# Patient Record
Sex: Female | Born: 1980 | Race: White | Hispanic: No | Marital: Married | Smoking: Never smoker
Health system: Southern US, Community
[De-identification: ages and names within clinical notes are randomized; demographics above are authoritative.]

## PROBLEM LIST (undated history)

## (undated) ENCOUNTER — Inpatient Hospital Stay (HOSPITAL_COMMUNITY): Payer: Self-pay

## (undated) DIAGNOSIS — A6920 Lyme disease, unspecified: Secondary | ICD-10-CM

## (undated) DIAGNOSIS — D649 Anemia, unspecified: Secondary | ICD-10-CM

## (undated) DIAGNOSIS — F411 Generalized anxiety disorder: Secondary | ICD-10-CM

## (undated) DIAGNOSIS — E7800 Pure hypercholesterolemia, unspecified: Secondary | ICD-10-CM

## (undated) DIAGNOSIS — Z8619 Personal history of other infectious and parasitic diseases: Secondary | ICD-10-CM

## (undated) DIAGNOSIS — Z141 Cystic fibrosis carrier: Secondary | ICD-10-CM

## (undated) DIAGNOSIS — M25562 Pain in left knee: Secondary | ICD-10-CM

## (undated) DIAGNOSIS — Z8759 Personal history of other complications of pregnancy, childbirth and the puerperium: Secondary | ICD-10-CM

## (undated) DIAGNOSIS — M25561 Pain in right knee: Secondary | ICD-10-CM

## (undated) DIAGNOSIS — R51 Headache: Secondary | ICD-10-CM

## (undated) DIAGNOSIS — T7840XA Allergy, unspecified, initial encounter: Secondary | ICD-10-CM

## (undated) DIAGNOSIS — R519 Headache, unspecified: Secondary | ICD-10-CM

## (undated) DIAGNOSIS — J309 Allergic rhinitis, unspecified: Secondary | ICD-10-CM

## (undated) HISTORY — DX: Personal history of other complications of pregnancy, childbirth and the puerperium: Z87.59

## (undated) HISTORY — DX: Pain in right knee: M25.561

## (undated) HISTORY — DX: Lyme disease, unspecified: A69.20

## (undated) HISTORY — DX: Cystic fibrosis carrier: Z14.1

## (undated) HISTORY — DX: Pain in right knee: M25.562

## (undated) HISTORY — DX: Personal history of other infectious and parasitic diseases: Z86.19

## (undated) HISTORY — DX: Allergy, unspecified, initial encounter: T78.40XA

## (undated) HISTORY — DX: Allergic rhinitis, unspecified: J30.9

## (undated) HISTORY — DX: Generalized anxiety disorder: F41.1

## (undated) HISTORY — DX: Pure hypercholesterolemia, unspecified: E78.00

---

## 2003-03-23 ENCOUNTER — Other Ambulatory Visit: Admission: RE | Admit: 2003-03-23 | Discharge: 2003-03-23 | Payer: Self-pay | Admitting: Obstetrics and Gynecology

## 2011-02-03 NOTE — L&D Delivery Note (Signed)
Delivery Note  SVD viable female Apgars 8,9 over 2nd degree ML Lac.  Placenta delivered spontaneously intact with 3VC. Repair with 2-0 Chromic with good support and hemostasis noted and R/V exam confirms.  PH art was snet.  [place] cord blood was done.  Mild pp atony responded to massage, MEU of clots and pr of cytotec  Mother and baby were doing well.  EBL 450cc  Candice Camp, MD

## 2011-02-09 LAB — OB RESULTS CONSOLE ABO/RH: RH Type: POSITIVE

## 2011-02-09 LAB — OB RESULTS CONSOLE ANTIBODY SCREEN: Antibody Screen: NEGATIVE

## 2011-02-09 LAB — OB RESULTS CONSOLE RPR: RPR: NONREACTIVE

## 2011-08-20 LAB — OB RESULTS CONSOLE GBS: GBS: NEGATIVE

## 2011-09-02 ENCOUNTER — Encounter (HOSPITAL_COMMUNITY): Payer: Self-pay | Admitting: *Deleted

## 2011-09-02 ENCOUNTER — Encounter (HOSPITAL_COMMUNITY): Payer: Self-pay | Admitting: Anesthesiology

## 2011-09-02 ENCOUNTER — Inpatient Hospital Stay (HOSPITAL_COMMUNITY)
Admission: AD | Admit: 2011-09-02 | Discharge: 2011-09-04 | DRG: 372 | Disposition: A | Discharge status: Home / Self Care | Payer: BC Managed Care – PPO | Source: Ambulatory Visit | Attending: Obstetrics and Gynecology | Admitting: Obstetrics and Gynecology

## 2011-09-02 ENCOUNTER — Inpatient Hospital Stay (HOSPITAL_COMMUNITY): Payer: BC Managed Care – PPO | Admitting: Anesthesiology

## 2011-09-02 DIAGNOSIS — O139 Gestational [pregnancy-induced] hypertension without significant proteinuria, unspecified trimester: Principal | ICD-10-CM | POA: Diagnosis present

## 2011-09-02 LAB — CBC
HCT: 36.6 % (ref 36.0–46.0)
Hemoglobin: 12.3 g/dL (ref 12.0–15.0)
RBC: 4.09 MIL/uL (ref 3.87–5.11)

## 2011-09-02 LAB — COMPREHENSIVE METABOLIC PANEL
ALT: 12 U/L (ref 0–35)
AST: 18 U/L (ref 0–37)
Alkaline Phosphatase: 117 U/L (ref 39–117)
CO2: 23 mEq/L (ref 19–32)
Chloride: 101 mEq/L (ref 96–112)
GFR calc non Af Amer: >90 mL/min (ref >90)
Sodium: 135 mEq/L (ref 135–145)
Total Bilirubin: 0.4 mg/dL (ref 0.3–1.2)

## 2011-09-02 LAB — RPR: RPR Ser Ql: NONREACTIVE

## 2011-09-02 MED ORDER — ONDANSETRON HCL 4 MG/2ML IJ SOLN: 4.0000 mg | Freq: Four times a day (QID) | INTRAMUSCULAR | Status: DC | PRN

## 2011-09-02 MED ORDER — MISOPROSTOL 200 MCG PO TABS
ORAL_TABLET | ORAL | Status: AC
  Filled 2011-09-02: qty 4

## 2011-09-02 MED ORDER — DIPHENHYDRAMINE HCL 50 MG/ML IJ SOLN: 12.5000 mg | INTRAMUSCULAR | Status: DC | PRN

## 2011-09-02 MED ORDER — MISOPROSTOL 200 MCG PO TABS
800.0000 ug | ORAL_TABLET | Freq: Once | ORAL | Status: AC
  Administered 2011-09-02: 800 ug via RECTAL

## 2011-09-02 MED ORDER — PHENYLEPHRINE 40 MCG/ML (10ML) SYRINGE FOR IV PUSH (FOR BLOOD PRESSURE SUPPORT)
80.0000 ug | PREFILLED_SYRINGE | INTRAVENOUS | Status: DC | PRN
  Filled 2011-09-02: qty 5

## 2011-09-02 MED ORDER — EPHEDRINE 5 MG/ML INJ: 10.0000 mg | INTRAVENOUS | Status: DC | PRN

## 2011-09-02 MED ORDER — OXYTOCIN 40 UNITS IN LACTATED RINGERS INFUSION - SIMPLE MED
62.5000 mL/h | Freq: Once | INTRAVENOUS | Status: AC
  Administered 2011-09-02: 2 mL/h via INTRAVENOUS

## 2011-09-02 MED ORDER — LACTATED RINGERS IV SOLN
500.0000 mL | INTRAVENOUS | Status: DC | PRN
  Administered 2011-09-02 (×2): 1000 mL via INTRAVENOUS

## 2011-09-02 MED ORDER — PHENYLEPHRINE 40 MCG/ML (10ML) SYRINGE FOR IV PUSH (FOR BLOOD PRESSURE SUPPORT): 80.0000 ug | PREFILLED_SYRINGE | INTRAVENOUS | Status: DC | PRN

## 2011-09-02 MED ORDER — LIDOCAINE HCL (PF) 1 % IJ SOLN
30.0000 mL | INTRAMUSCULAR | Status: DC | PRN
  Filled 2011-09-02: qty 30

## 2011-09-02 MED ORDER — BUTORPHANOL TARTRATE 1 MG/ML IJ SOLN: 1.0000 mg | INTRAMUSCULAR | Status: DC | PRN

## 2011-09-02 MED ORDER — ACETAMINOPHEN 325 MG PO TABS: 650.0000 mg | ORAL_TABLET | ORAL | Status: DC | PRN

## 2011-09-02 MED ORDER — FENTANYL 2.5 MCG/ML BUPIVACAINE 1/10 % EPIDURAL INFUSION (WH - ANES)
14.0000 mL/h | INTRAMUSCULAR | Status: DC
  Administered 2011-09-02: 14 mL/h via EPIDURAL
  Filled 2011-09-02 (×2): qty 60

## 2011-09-02 MED ORDER — LACTATED RINGERS IV SOLN
INTRAVENOUS | Status: DC
  Administered 2011-09-02: 13:00:00 via INTRAVENOUS

## 2011-09-02 MED ORDER — LIDOCAINE HCL (PF) 1 % IJ SOLN
INTRAMUSCULAR | Status: DC | PRN
  Administered 2011-09-02 (×2): 8 mL

## 2011-09-02 MED ORDER — CITRIC ACID-SODIUM CITRATE 334-500 MG/5ML PO SOLN: 30.0000 mL | ORAL | Status: DC | PRN

## 2011-09-02 MED ORDER — TERBUTALINE SULFATE 1 MG/ML IJ SOLN: 0.2500 mg | Freq: Once | INTRAMUSCULAR | Status: AC | PRN

## 2011-09-02 MED ORDER — LACTATED RINGERS IV SOLN: 500.0000 mL | Freq: Once | INTRAVENOUS | Status: DC

## 2011-09-02 MED ORDER — IBUPROFEN 600 MG PO TABS: 600.0000 mg | ORAL_TABLET | Freq: Four times a day (QID) | ORAL | Status: DC | PRN

## 2011-09-02 MED ORDER — OXYTOCIN 40 UNITS IN LACTATED RINGERS INFUSION - SIMPLE MED
INTRAVENOUS | Status: AC
  Administered 2011-09-02: 2 mL/h via INTRAVENOUS
  Filled 2011-09-02: qty 1000

## 2011-09-02 MED ORDER — FENTANYL 2.5 MCG/ML BUPIVACAINE 1/10 % EPIDURAL INFUSION (WH - ANES)
INTRAMUSCULAR | Status: DC | PRN
  Administered 2011-09-02: 14 mL/h via EPIDURAL

## 2011-09-02 MED ORDER — OXYCODONE-ACETAMINOPHEN 5-325 MG PO TABS: 1.0000 | ORAL_TABLET | ORAL | Status: DC | PRN

## 2011-09-02 MED ORDER — EPHEDRINE 5 MG/ML INJ
10.0000 mg | INTRAVENOUS | Status: DC | PRN
  Filled 2011-09-02: qty 4

## 2011-09-02 MED ORDER — OXYTOCIN BOLUS FROM INFUSION
250.0000 mL | Freq: Once | INTRAVENOUS | Status: AC
  Administered 2011-09-02: 250 mL via INTRAVENOUS
  Filled 2011-09-02: qty 500

## 2011-09-02 MED ORDER — OXYTOCIN 40 UNITS IN LACTATED RINGERS INFUSION - SIMPLE MED: 1.0000 m[IU]/min | INTRAVENOUS | Status: DC

## 2011-09-02 MED ORDER — FLEET ENEMA 7-19 GM/118ML RE ENEM: 1.0000 | ENEMA | RECTAL | Status: DC | PRN

## 2011-09-02 NOTE — H&P (Signed)
Briell A SYRING is a 31 y.o. female presenting for early labor and augmentation/induction for Gestational HTN.  Annalysa has been closely monitored for the last 3-4 weeks for Gest HTN and seen in office today with BP 148/92.  No HAs, or scotomata or RUQ pain, but just doesn't feel well.  Has had cervical change and is now 4 cm so admitted to deliver.  Pregnancy otherwise uncomplicated.Marland Kitchen History OB History    No data available     No past medical history on file. No past surgical history on file. Family History: family history is not on file. Social History:  does not have a smoking history on file. She does not have any smokeless tobacco history on file. Her alcohol and drug histories not on file.   Prenatal Transfer Tool  Maternal Diabetes: No Genetic Screening: Normal Maternal Ultrasounds/Referrals: Normal Fetal Ultrasounds or other Referrals:  None Maternal Substance Abuse:  No Significant Maternal Medications:  None Significant Maternal Lab Results:  None Other Comments:  None  ROS    There were no vitals taken for this visit. Exam Physical Exam   Gravid ,nt  FHR 140s Cx  4/80/-2 DTRs 2/4 Bil no clonus, trace edema Prenatal labs: ABO, Rh:   Antibody:   Rubella:   RPR:    HBsAg:    HIV:    GBS:     Assessment/Plan: IUP at 38+ weeks with Gestational HTN and early labor Plan AROM and anticipate SVD Check PIH labs   Vallie Teters C 09/02/2011, 12:28 PM

## 2011-09-02 NOTE — Anesthesia Procedure Notes (Signed)
Epidural Patient location during procedure: OB Start time: 09/02/2011 4:39 PM End time: 09/02/2011 4:44 PM Reason for block: procedure for pain  Staffing Anesthesiologist: Sandrea Hughs Performed by: anesthesiologist   Preanesthetic Checklist Completed: patient identified, site marked, surgical consent, pre-op evaluation, timeout performed, IV checked, risks and benefits discussed and monitors and equipment checked  Epidural Patient position: sitting Prep: site prepped and draped and DuraPrep Patient monitoring: continuous pulse ox and blood pressure Approach: midline Injection technique: LOR air  Needle:  Needle type: Tuohy  Needle gauge: 17 G Needle length: 9 cm Needle insertion depth: 5 cm cm Catheter type: closed end flexible Catheter size: 19 Gauge Catheter at skin depth: 10 cm Test dose: negative and Other  Assessment Sensory level: T8 Events: blood not aspirated, injection not painful, no injection resistance, negative IV test and no paresthesia

## 2011-09-02 NOTE — Progress Notes (Signed)
1835- blood noted in IUPC . Flushed easily  With 10ml of lr. IUPC clear.

## 2011-09-02 NOTE — Anesthesia Preprocedure Evaluation (Signed)

## 2011-09-03 LAB — CBC
Platelets: 175 10*3/uL (ref 150–400)
RBC: 3.19 MIL/uL — ABNORMAL LOW (ref 3.87–5.11)
RDW: 13.6 % (ref 11.5–15.5)
WBC: 21.2 10*3/uL — ABNORMAL HIGH (ref 4.0–10.5)

## 2011-09-03 MED ORDER — OXYCODONE-ACETAMINOPHEN 5-325 MG PO TABS: 1.0000 | ORAL_TABLET | ORAL | Status: DC | PRN

## 2011-09-03 MED ORDER — SIMETHICONE 80 MG PO CHEW: 80.0000 mg | CHEWABLE_TABLET | ORAL | Status: DC | PRN

## 2011-09-03 MED ORDER — LANOLIN HYDROUS EX OINT
TOPICAL_OINTMENT | CUTANEOUS | Status: DC | PRN
Start: 2011-09-03 — End: 2011-09-04

## 2011-09-03 MED ORDER — WITCH HAZEL-GLYCERIN EX PADS: 1.0000 "application " | MEDICATED_PAD | CUTANEOUS | Status: DC | PRN

## 2011-09-03 MED ORDER — IBUPROFEN 600 MG PO TABS
600.0000 mg | ORAL_TABLET | Freq: Four times a day (QID) | ORAL | Status: DC
  Administered 2011-09-03 – 2011-09-04 (×7): 600 mg via ORAL
  Filled 2011-09-03 (×6): qty 1

## 2011-09-03 MED ORDER — PRENATAL MULTIVITAMIN CH: 1.0000 | ORAL_TABLET | Freq: Every day | ORAL | Status: DC

## 2011-09-03 MED ORDER — MEASLES, MUMPS & RUBELLA VAC ~~LOC~~ INJ
0.5000 mL | INJECTION | Freq: Once | SUBCUTANEOUS | Status: DC
  Filled 2011-09-03: qty 0.5

## 2011-09-03 MED ORDER — BENZOCAINE-MENTHOL 20-0.5 % EX AERO: 1.0000 "application " | INHALATION_SPRAY | CUTANEOUS | Status: DC | PRN

## 2011-09-03 MED ORDER — DIPHENHYDRAMINE HCL 25 MG PO CAPS: 25.0000 mg | ORAL_CAPSULE | Freq: Four times a day (QID) | ORAL | Status: DC | PRN

## 2011-09-03 MED ORDER — PRENATAL MULTIVITAMIN CH
1.0000 | ORAL_TABLET | Freq: Every day | ORAL | Status: DC
  Administered 2011-09-03 – 2011-09-04 (×2): 1 via ORAL
  Filled 2011-09-03 (×2): qty 1

## 2011-09-03 MED ORDER — FERROUS SULFATE 325 (65 FE) MG PO TABS
325.0000 mg | ORAL_TABLET | Freq: Three times a day (TID) | ORAL | Status: DC
  Administered 2011-09-03 – 2011-09-04 (×4): 325 mg via ORAL
  Filled 2011-09-03 (×4): qty 1

## 2011-09-03 MED ORDER — ONDANSETRON HCL 4 MG PO TABS: 4.0000 mg | ORAL_TABLET | ORAL | Status: DC | PRN

## 2011-09-03 MED ORDER — ZOLPIDEM TARTRATE 5 MG PO TABS: 5.0000 mg | ORAL_TABLET | Freq: Every evening | ORAL | Status: DC | PRN

## 2011-09-03 MED ORDER — SENNOSIDES-DOCUSATE SODIUM 8.6-50 MG PO TABS
2.0000 | ORAL_TABLET | Freq: Every day | ORAL | Status: DC
  Administered 2011-09-03: 2 via ORAL

## 2011-09-03 MED ORDER — MEDROXYPROGESTERONE ACETATE 150 MG/ML IM SUSP: 150.0000 mg | INTRAMUSCULAR | Status: DC | PRN

## 2011-09-03 MED ORDER — DIBUCAINE 1 % RE OINT: 1.0000 "application " | TOPICAL_OINTMENT | RECTAL | Status: DC | PRN

## 2011-09-03 MED ORDER — TETANUS-DIPHTH-ACELL PERTUSSIS 5-2.5-18.5 LF-MCG/0.5 IM SUSP: 0.5000 mL | Freq: Once | INTRAMUSCULAR | Status: DC

## 2011-09-03 MED ORDER — ONDANSETRON HCL 4 MG/2ML IJ SOLN: 4.0000 mg | INTRAMUSCULAR | Status: DC | PRN

## 2011-09-03 NOTE — Progress Notes (Signed)
Post Partum Day 1 Subjective: voiding, tolerating PO and feeling a little "lightheaded"  Objective: Blood pressure 116/82, pulse 88, temperature 98.4 F (36.9 C), temperature source Axillary, resp. rate 18, height 5\' 2"  (1.575 m), weight 68.493 kg (151 lb), SpO2 98.00%, unknown if currently breastfeeding.  Physical Exam:  General: alert and cooperative Lochia: appropriate Uterine Fundus: firm Incision: perineum intact DVT Evaluation: No evidence of DVT seen on physical exam.   Basename 09/03/11 0615 09/02/11 1320  HGB 9.7* 12.3  HCT 28.5* 36.6    Assessment/Plan: Plan for discharge tomorrow CBC in am Feso4   LOS: 1 day   Debbie French G 09/03/2011, 8:07 AM

## 2011-09-03 NOTE — Anesthesia Postprocedure Evaluation (Signed)
Anesthesia Post Note  Patient: Debbie French  Procedure(s) Performed: * No procedures listed *  Anesthesia type: Epidural  Patient location: Mother/Baby  Post pain: Pain level controlled  Post assessment: Post-op Vital signs reviewed  Last Vitals:  Filed Vitals:   09/03/11 1345  BP: 123/90  Pulse: 97  Temp: 36.8 C  Resp: 18    Post vital signs: Reviewed  Level of consciousness: awake  Complications: No apparent anesthesia complications

## 2011-09-04 LAB — CBC WITH DIFFERENTIAL/PLATELET
Basophils Absolute: 0 10*3/uL (ref 0.0–0.1)
Basophils Relative: 0 % (ref 0–1)
HCT: 23.1 % — ABNORMAL LOW (ref 36.0–46.0)
Hemoglobin: 7.6 g/dL — ABNORMAL LOW (ref 12.0–15.0)
Lymphocytes Relative: 22 % (ref 12–46)
MCHC: 32.9 g/dL (ref 30.0–36.0)
Monocytes Absolute: 1.1 10*3/uL — ABNORMAL HIGH (ref 0.1–1.0)
Neutro Abs: 9.8 10*3/uL — ABNORMAL HIGH (ref 1.7–7.7)
Neutrophils Relative %: 69 % (ref 43–77)
RDW: 13.9 % (ref 11.5–15.5)
WBC: 14.1 10*3/uL — ABNORMAL HIGH (ref 4.0–10.5)

## 2011-09-04 MED ORDER — FERROUS SULFATE 325 (65 FE) MG PO TABS: 325.0000 mg | ORAL_TABLET | Freq: Three times a day (TID) | ORAL | Status: DC

## 2011-09-04 MED ORDER — IBUPROFEN 600 MG PO TABS: 600.0000 mg | ORAL_TABLET | Freq: Four times a day (QID) | ORAL | Status: AC

## 2011-09-04 NOTE — Discharge Summary (Signed)
Obstetric Discharge Summary Reason for Admission: induction of labor Prenatal Procedures: ultrasound Intrapartum Procedures: spontaneous vaginal delivery Postpartum Procedures: none Complications-Operative and Postpartum: 2 degree perineal laceration Hemoglobin  Date Value Range Status  09/04/2011 7.6* 12.0 - 15.0 French/dL Final     DELTA CHECK NOTED     REPEATED TO VERIFY     HCT  Date Value Range Status  09/04/2011 23.1* 36.0 - 46.0 % Final    Physical Exam:  General: alert and cooperative Lochia: appropriate Uterine Fundus: firm Incision: perineum intact DVT Evaluation: No evidence of DVT seen on physical exam.  Discharge Diagnoses: Term Pregnancy-delivered  Discharge Information: Date: 09/04/2011 Activity: pelvic rest Diet: routine Medications: PNV, Ibuprofen and Iron Condition: stable Instructions: refer to practice specific booklet Discharge to: home   Newborn Data: Live born female  Birth Weight: 7 lb 5.6 oz (3335 French) APGAR: 9, 9  Home with mother.  Debbie French 09/04/2011, 8:14 AM

## 2011-09-13 ENCOUNTER — Inpatient Hospital Stay (HOSPITAL_COMMUNITY)
Admission: AD | Admit: 2011-09-13 | Discharge: 2011-09-13 | Disposition: A | Discharge status: Home / Self Care | Payer: BC Managed Care – PPO | Source: Ambulatory Visit | Attending: Obstetrics and Gynecology | Admitting: Obstetrics and Gynecology

## 2011-09-13 DIAGNOSIS — O9122 Nonpurulent mastitis associated with the puerperium: Secondary | ICD-10-CM | POA: Insufficient documentation

## 2011-09-13 DIAGNOSIS — N61 Mastitis without abscess: Secondary | ICD-10-CM

## 2011-09-13 DIAGNOSIS — O864 Pyrexia of unknown origin following delivery: Secondary | ICD-10-CM | POA: Insufficient documentation

## 2011-09-13 LAB — URINE MICROSCOPIC-ADD ON

## 2011-09-13 LAB — URINALYSIS, ROUTINE W REFLEX MICROSCOPIC
Bilirubin Urine: NEGATIVE
Nitrite: NEGATIVE
Specific Gravity, Urine: <1.005 — ABNORMAL LOW (ref 1.005–1.030)
Urobilinogen, UA: 0.2 mg/dL (ref 0.0–1.0)
pH: 6.5 (ref 5.0–8.0)

## 2011-09-13 MED ORDER — CEPHALEXIN 500 MG PO CAPS: 500.0000 mg | ORAL_CAPSULE | Freq: Four times a day (QID) | ORAL | Status: AC

## 2011-09-13 MED ORDER — CEPHALEXIN 500 MG PO CAPS
500.0000 mg | ORAL_CAPSULE | Freq: Once | ORAL | Status: AC
  Administered 2011-09-13: 500 mg via ORAL
  Filled 2011-09-13: qty 1

## 2011-09-13 NOTE — MAU Provider Note (Signed)
  History     CSN: [id number]  Arrival date and time: 09/13/11 2956   First Provider Initiated Contact with Patient 09/13/11 [phone]      Chief Complaint  Patient presents with  . Fever  . Breast Pain   HPI This is a 31 y.o. female who is s/p SVD on 09/02/11 who presents with mastitis. She had a fever last night which improved with ibuprofen. Has had trouble breastfeeding and has been pumping instead of putting baby onto breast.  Has gotten less milk from right side.  Now has redness and hard areas on right breast. Baby has jaundice with Bilirubin of 19.  OB History    Grav Para Term Preterm Abortions TAB SAB Ect Mult Living   1 1 1  0 0 0 0 0 0 1      No past medical history on file.  No past surgical history on file.  No family history on file.  History  Substance Use Topics  . Smoking status: Never Smoker   . Smokeless tobacco: Never Used  . Alcohol Use: No    Allergies:  Allergies  Allergen Reactions  . Amoxicillin Hives  . Penicillins Hives    Prescriptions prior to admission  Medication Sig Dispense Refill  . ferrous sulfate 325 (65 FE) MG tablet Take 1 tablet (325 mg total) by mouth 3 (three) times daily with meals.  90 tablet  1  . ibuprofen (ADVIL,MOTRIN) 600 MG tablet Take 1 tablet (600 mg total) by mouth every 6 (six) hours.  30 tablet  1  . Prenatal Vit-Fe Fumarate-FA (PRENATAL MULTIVITAMIN) TABS Take 1 tablet by mouth daily.        ROS As in HPI  Physical Exam   Blood pressure 127/77, pulse 106, temperature 98.9 F (37.2 C), temperature source Oral, resp. rate 18, height 5\' 2"  (1.575 m), weight 133 lb 4 oz (60.442 kg), SpO2 99.00%, unknown if currently breastfeeding.  Physical Exam  Constitutional: She is oriented to person, place, and time. She appears well-developed and well-nourished. No distress.  Cardiovascular: Normal rate.   Respiratory: Effort normal.  GI: Soft. She exhibits no distension. There is no tenderness. There is no rebound and no  guarding.  Musculoskeletal: Normal range of motion.  Neurological: She is alert and oriented to person, place, and time.  Skin: Skin is warm and dry.  Psychiatric: She has a normal mood and affect.   Right breast has erethema on lower side and induration in places around breast.  Left breast normal.   MAU Course  Procedures  MDM Discussed allergy to Amoxicillin.  Had hives 3-4 days into taking Amoxicillin about 5 years ago. Pharmacist states there is a 5% cross-reactivity with Keflex.  Will try a dose here and observe.  Assessment and Plan  A:  Postpartum x 12 days      Mastitis  P:  Discussed with Dr Arelia Sneddon       Rx Keflex x 10 days.       Followup in office  [facility] 09/13/2011, 7:13 AM

## 2011-09-13 NOTE — MAU Note (Addendum)
Patient had a vaginal delivery on 7/31. She isin with c/o fever for 2 days , right breast soreness. She is lactating and pumping every 3 hours due to difficulty of latching. She denies usual abdominal pain. Patient states that her temperature was 113f at home and she took 600mg  motrin at 0400am.

## 2011-09-16 ENCOUNTER — Inpatient Hospital Stay (HOSPITAL_COMMUNITY): Admission: AD | Admit: 2011-09-16 | Payer: Self-pay | Source: Ambulatory Visit | Admitting: Obstetrics and Gynecology

## 2013-10-12 LAB — OB RESULTS CONSOLE ABO/RH: RH Type: POSITIVE

## 2013-10-12 LAB — OB RESULTS CONSOLE ANTIBODY SCREEN: Antibody Screen: NEGATIVE

## 2013-10-12 LAB — OB RESULTS CONSOLE RPR: RPR: NONREACTIVE

## 2013-10-12 LAB — OB RESULTS CONSOLE HEPATITIS B SURFACE ANTIGEN: Hepatitis B Surface Ag: NEGATIVE

## 2013-10-12 LAB — OB RESULTS CONSOLE HIV ANTIBODY (ROUTINE TESTING): HIV: NONREACTIVE

## 2013-10-12 LAB — OB RESULTS CONSOLE RUBELLA ANTIBODY, IGM: RUBELLA: IMMUNE

## 2013-10-23 LAB — OB RESULTS CONSOLE GC/CHLAMYDIA
CHLAMYDIA, DNA PROBE: NEGATIVE
GC PROBE AMP, GENITAL: NEGATIVE

## 2013-12-04 ENCOUNTER — Encounter (HOSPITAL_COMMUNITY): Payer: Self-pay | Admitting: *Deleted

## 2014-02-02 ENCOUNTER — Inpatient Hospital Stay (HOSPITAL_COMMUNITY)
Admission: AD | Admit: 2014-02-02 | Discharge: 2014-02-02 | Disposition: A | Discharge status: Home / Self Care | Payer: BC Managed Care – PPO | Source: Ambulatory Visit | Attending: Obstetrics and Gynecology | Admitting: Obstetrics and Gynecology

## 2014-02-02 ENCOUNTER — Encounter (HOSPITAL_COMMUNITY): Payer: Self-pay | Admitting: *Deleted

## 2014-02-02 DIAGNOSIS — Z3A24 24 weeks gestation of pregnancy: Secondary | ICD-10-CM | POA: Insufficient documentation

## 2014-02-02 DIAGNOSIS — H538 Other visual disturbances: Secondary | ICD-10-CM | POA: Insufficient documentation

## 2014-02-02 DIAGNOSIS — O9989 Other specified diseases and conditions complicating pregnancy, childbirth and the puerperium: Secondary | ICD-10-CM | POA: Diagnosis not present

## 2014-02-02 LAB — URINALYSIS, ROUTINE W REFLEX MICROSCOPIC
Bilirubin Urine: NEGATIVE
Glucose, UA: NEGATIVE mg/dL
Hgb urine dipstick: NEGATIVE
Ketones, ur: NEGATIVE mg/dL
LEUKOCYTES UA: NEGATIVE
NITRITE: NEGATIVE
PH: 6.5 (ref 5.0–8.0)
Protein, ur: NEGATIVE mg/dL
SPECIFIC GRAVITY, URINE: 1.01 (ref 1.005–1.030)
Urobilinogen, UA: 0.2 mg/dL (ref 0.0–1.0)

## 2014-02-02 NOTE — Discharge Instructions (Signed)
Blurred Vision °You have been seen today complaining of blurred vision. This means you have a loss of ability to see small details.  °CAUSES  °Blurred vision can be a symptom of underlying eye problems, such as: °· Aging of the eye (presbyopia). °· Glaucoma. °· Cataracts. °· Eye infection. °· Eye-related migraine. °· Diabetes mellitus. °· Fatigue. °· Migraine headaches. °· High blood pressure. °· Breakdown of the back of the eye (macular degeneration). °· Problems caused by some medications. °The most common cause of blurred vision is the need for eyeglasses or a new prescription. Today in the emergency department, no cause for your blurred vision can be found. °SYMPTOMS  °Blurred vision is the loss of visual sharpness and detail (acuity). °DIAGNOSIS  °Should blurred vision continue, you should see your caregiver. If your caregiver is your primary care physician, he or she may choose to refer you to another specialist.  °TREATMENT  °Do not ignore your blurred vision. Make sure to have it checked out to see if further treatment or referral is necessary. °SEEK MEDICAL CARE IF:  °You are unable to get into a specialist so we can help you with a referral. °SEEK IMMEDIATE MEDICAL CARE IF: °You have severe eye pain, severe headache, or sudden loss of vision. °MAKE SURE YOU:  °· Understand these instructions. °· Will watch your condition. °· Will get help right away if you are not doing well or get worse. °Document Released: 01/22/2003 Document Revised: 04/13/2011 Document Reviewed: 08/24/2007 °ExitCare® Patient Information ©2015 [employer]. This information is not intended to replace advice given to you by your health care provider. Make sure you discuss any questions you have with your health care provider. ° °

## 2014-02-02 NOTE — L&D Delivery Note (Signed)
Delivery Note  SVD viable female Apgars 8,9 over 2nd degree ML lac.  Placenta delivered spontaneously intact with 3VC. Repair with 209 Chromic with good support and hemostasis noted and R/V exam confirms.  PH art was sent.  [place] cord blood was not done.  Mother and baby were doing well.  EBL 300cc  Candice Campavid Leanard Dimaio, MD

## 2014-02-02 NOTE — MAU Note (Signed)
Pt reports report blurry vision in L eye

## 2014-02-02 NOTE — MAU Provider Note (Signed)
History     CSN: [id number]  Arrival date and time: 02/02/14 1332   First Provider Initiated Contact with Patient 02/02/14 1358      Chief Complaint  Patient presents with  . Blurred Vision   HPI This is a 34 y.o. female at [redacted]w[redacted]d who presents with c/o blurry vision in left eye. Has had preeclampsia before, so was concerned. Denies headache, seeing spots, edema.   RN Note:  Expand All Collapse All   Pt reports report blurry vision in L eye           OB History    Gravida Para Term Preterm AB TAB SAB Ectopic Multiple Living   0 0 0 0 0 0 1      History reviewed. No pertinent past medical history.  History reviewed. No pertinent past surgical history.  History reviewed. No pertinent family history.  History  Substance Use Topics  . Smoking status: Never Smoker   . Smokeless tobacco: Never Used  . Alcohol Use: No    Allergies:  Allergies  Allergen Reactions  . Amoxicillin Hives  . Penicillins Hives    Prescriptions prior to admission  Medication Sig Dispense Refill Last Dose  . ferrous sulfate 325 (65 FE) MG tablet Take 1 tablet (325 mg total) by mouth 3 (three) times daily with meals. 90 tablet 1 09/12/2011 at Unknown  . Prenatal Vit-Fe Fumarate-FA (PRENATAL MULTIVITAMIN) TABS Take 1 tablet by mouth at bedtime.    02/02/2014 at Unknown time    Review of Systems  Constitutional: Negative for fever, chills and malaise/fatigue.  Eyes: Positive for blurred vision (left ey only) and redness. Negative for double vision, photophobia, pain and discharge.  Cardiovascular: Negative for chest pain and leg swelling.  Gastrointestinal: Negative for nausea, vomiting and abdominal pain.  Neurological: Negative for headaches.   Physical Exam   Blood pressure 113/69, pulse 87, temperature 98.1 F (36.7 C), temperature source Oral, resp. rate 16, unknown if currently breastfeeding. Filed Vitals:   02/02/14 1344 02/02/14 1356 02/02/14 1417 02/02/14 1420  BP:  129/78 119/70 112/66 113/69  Pulse: 90 83 81 87  Temp: 98.1 F (36.7 C)     TempSrc: Oral     Resp: 16       Physical Exam  Constitutional: She is oriented to person, place, and time. She appears well-developed and well-nourished. No distress.  HENT:  Head: Normocephalic.  Cardiovascular: Normal rate and normal heart sounds.  Exam reveals no gallop and no friction rub.   No murmur heard. Respiratory: Effort normal and breath sounds normal. No respiratory distress. She has no wheezes. She has no rales. She exhibits no tenderness.  GI: Soft.  Musculoskeletal: Normal range of motion. She exhibits no edema or tenderness.  Neurological: She is alert and oriented to person, place, and time. She displays normal reflexes. She exhibits normal muscle tone.  Skin: Skin is warm and dry.  Psychiatric: She has a normal mood and affect.   FHR reassuring No contractions MAU Course  Procedures  MDM DIscussed with Dr Henderson Cloud. No need to do PIH labs since she is normotensive  Assessment and Plan  A:  SIUP at [redacted]w[redacted]d       Left eye blurred vision      Possible conjunctivitis, though no discharge      No signs of preeclampsia  P:  Discharge home       PIH precautions      Advised to go see Opthamologist  Wynelle Bourgeois 02/02/2014, 2:27 PM

## 2014-02-02 NOTE — MAU Note (Addendum)
Noted over the last few days, left eye has been blurring.  Has general bad vision. Slept in contacts last Friday, when woke up- took out contacts, when went to put in new ones, the left eye was hurting.  No headache, epigastric pain or swelling.  Checked BP at home, was normal. Called MD because of the persistent blurring- was instructed to come in .

## 2014-04-08 ENCOUNTER — Inpatient Hospital Stay (HOSPITAL_COMMUNITY)
Admission: AD | Admit: 2014-04-08 | Discharge: 2014-04-09 | Disposition: A | Discharge status: Home / Self Care | Payer: BLUE CROSS/BLUE SHIELD | Source: Ambulatory Visit | Attending: Obstetrics and Gynecology | Admitting: Obstetrics and Gynecology

## 2014-04-08 ENCOUNTER — Encounter (HOSPITAL_COMMUNITY): Payer: Self-pay

## 2014-04-08 DIAGNOSIS — O4703 False labor before 37 completed weeks of gestation, third trimester: Secondary | ICD-10-CM

## 2014-04-08 DIAGNOSIS — O9989 Other specified diseases and conditions complicating pregnancy, childbirth and the puerperium: Secondary | ICD-10-CM | POA: Diagnosis not present

## 2014-04-08 DIAGNOSIS — R030 Elevated blood-pressure reading, without diagnosis of hypertension: Secondary | ICD-10-CM | POA: Insufficient documentation

## 2014-04-08 DIAGNOSIS — Z3A34 34 weeks gestation of pregnancy: Secondary | ICD-10-CM | POA: Diagnosis not present

## 2014-04-08 DIAGNOSIS — Z880 Allergy status to penicillin: Secondary | ICD-10-CM | POA: Diagnosis not present

## 2014-04-08 DIAGNOSIS — R42 Dizziness and giddiness: Secondary | ICD-10-CM | POA: Diagnosis present

## 2014-04-08 LAB — COMPREHENSIVE METABOLIC PANEL
ALBUMIN: 3.2 g/dL — AB (ref 3.5–5.2)
ALK PHOS: 84 U/L (ref 39–117)
ALT: 14 U/L (ref 0–35)
ANION GAP: 9 (ref 5–15)
AST: 21 U/L (ref 0–37)
BUN: 6 mg/dL (ref 6–23)
CHLORIDE: 102 mmol/L (ref 96–112)
CO2: 24 mmol/L (ref 19–32)
Calcium: 8.9 mg/dL (ref 8.4–10.5)
Creatinine, Ser: 0.49 mg/dL — ABNORMAL LOW (ref 0.50–1.10)
GLUCOSE: 103 mg/dL — AB (ref 70–99)
POTASSIUM: 3.7 mmol/L (ref 3.5–5.1)
Sodium: 135 mmol/L (ref 135–145)
Total Bilirubin: 0.6 mg/dL (ref 0.3–1.2)
Total Protein: 6.5 g/dL (ref 6.0–8.3)

## 2014-04-08 LAB — URINALYSIS, ROUTINE W REFLEX MICROSCOPIC
Bilirubin Urine: NEGATIVE
Glucose, UA: NEGATIVE mg/dL
Ketones, ur: NEGATIVE mg/dL
LEUKOCYTES UA: NEGATIVE
NITRITE: NEGATIVE
PH: 6 (ref 5.0–8.0)
Protein, ur: NEGATIVE mg/dL
SPECIFIC GRAVITY, URINE: 1.005 (ref 1.005–1.030)
UROBILINOGEN UA: 0.2 mg/dL (ref 0.0–1.0)

## 2014-04-08 LAB — CBC
HEMATOCRIT: 34.9 % — AB (ref 36.0–46.0)
Hemoglobin: 11.9 g/dL — ABNORMAL LOW (ref 12.0–15.0)
MCH: 30.7 pg (ref 26.0–34.0)
MCHC: 34.1 g/dL (ref 30.0–36.0)
MCV: 89.9 fL (ref 78.0–100.0)
Platelets: 165 10*3/uL (ref 150–400)
RBC: 3.88 MIL/uL (ref 3.87–5.11)
RDW: 13.3 % (ref 11.5–15.5)
WBC: 10.9 10*3/uL — ABNORMAL HIGH (ref 4.0–10.5)

## 2014-04-08 LAB — PROTEIN / CREATININE RATIO, URINE
CREATININE, URINE: 14 mg/dL
Total Protein, Urine: <6 mg/dL

## 2014-04-08 LAB — URINE MICROSCOPIC-ADD ON: WBC, UA: NONE SEEN WBC/hpf (ref <3)

## 2014-04-08 MED ORDER — NIFEDIPINE 10 MG PO CAPS
10.0000 mg | ORAL_CAPSULE | Freq: Once | ORAL | Status: AC
  Administered 2014-04-08: 10 mg via ORAL
  Filled 2014-04-08: qty 1

## 2014-04-08 NOTE — MAU Note (Signed)
Felt flushed and light headed today so checked bp. Highest at home was 159/96. States had hypertension with last pregnancy. Denies headache. States feels like heart is racing and doesn't feel "normal". Abdominal soreness today, denies contractions. Denies LOF or vaginal bleeding. Positive fetal movement.

## 2014-04-08 NOTE — MAU Provider Note (Signed)
History     CSN: [id number]  Arrival date and time: 04/08/14 2046   First Provider Initiated Contact with Patient 04/08/14 2120      Chief Complaint  Patient presents with  . Hypertension   HPI  Ms. Nakyiah A Brunke is a 34 y.o. G2P1001 at [redacted]w[redacted]d here with report of 159/94 at home.  Reports feeling of dizziness which prompted taking blood pressure.  Denies any past problems with elevated blood pressure this pregnancy.  Denies headache, epigastric pain, or vision changes.  No report of contractions, vaginal bleeding, or leaking of fluid.    Pt reports not feeling contractions that are tracing on the monitor.  States in prior pregnancy was 4 cm by 36 weeks, but when on to deliver at 38 wks after IOL for preeclamspia.    History reviewed. No pertinent past medical history.  History reviewed. No pertinent past surgical history.  History reviewed. No pertinent family history.  History  Substance Use Topics  . Smoking status: Never Smoker   . Smokeless tobacco: Never Used  . Alcohol Use: No    Allergies:  Allergies  Allergen Reactions  . Amoxicillin Hives  . Penicillins Hives    Prescriptions prior to admission  Medication Sig Dispense Refill Last Dose  . Prenatal Vit-Fe Fumarate-FA (PRENATAL MULTIVITAMIN) TABS Take 1 tablet by mouth at bedtime.    04/08/2014 at Unknown time  . ferrous sulfate 325 (65 FE) MG tablet Take 1 tablet (325 mg total) by mouth 3 (three) times daily with meals. (Patient not taking: Reported on 04/08/2014) 90 tablet 1 09/12/2011 at Unknown    Review of Systems  Eyes: Negative for blurred vision and double vision.  Gastrointestinal: Negative for abdominal pain.  Neurological: Positive for dizziness. Negative for headaches.   Physical Exam   Blood pressure 126/86, pulse 106, temperature 98.7 F (37.1 C), temperature source Oral, resp. rate 18, height  (1.575 m), weight 69.31 kg (152 lb 12.8 oz), SpO2 100 %, not currently breastfeeding.   Filed  Vitals:   04/08/14 2217 04/08/14 2226 04/08/14 2241 04/08/14 2256  BP: 123/82 126/76 115/85 115/78  Pulse: 102 87 89 109  Temp:      TempSrc:      Resp:      Height:      Weight:      SpO2:        Physical Exam  Constitutional: She is oriented to person, place, and time. She appears well-developed and well-nourished. No distress.  HENT:  Head: Normocephalic.  Neck: Normal range of motion. Neck supple.  Cardiovascular: Normal rate, regular rhythm and normal heart sounds.   Respiratory: Effort normal and breath sounds normal.  GI: Soft. There is no tenderness.  Genitourinary: No bleeding in the vagina.  Musculoskeletal: Normal range of motion. She exhibits no edema.  Neurological: She is alert and oriented to person, place, and time. She displays normal reflexes.  Skin: Skin is warm and dry.   Dilation: 1 Effacement (%): Thick Exam by:: Margarita Mail, CNM  FHR 120's, [place] - 3-5 (not felt until needing to urinate, improved after bladder emptied)  2225 Consulted with Dr. Rana Snare > Reviewed HPI/Exam/labs > offer fluids and procardia, if improves discharge home with preterm labor precautions.    0020 Pt reports decrease in feeling the "tightening".  No report of pain.  Consulted with Dr. Rana Snare and reviewed patient report and continued tracing of contractions every 3-5 minutes after procardia > offer terb subq if continued contractions but  no cervical change discharge to home.    0100 Resolution of contractions > pt states no longer feeling the "tightening".   MAU Course  Procedures  Results for orders placed or performed during the hospital encounter of 04/08/14 (from the past 24 hour(s))  Urinalysis, Routine w reflex microscopic     Status: Abnormal   Collection Time: 04/08/14  8:54 PM  Result Value Ref Range   Color, Urine YELLOW YELLOW   APPearance CLEAR CLEAR   Specific Gravity, Urine 1.005 1.005 - 1.030   pH 6.0 5.0 - 8.0   Glucose, UA NEGATIVE NEGATIVE mg/dL   Hgb urine  dipstick TRACE (A) NEGATIVE   Bilirubin Urine NEGATIVE NEGATIVE   Ketones, ur NEGATIVE NEGATIVE mg/dL   Protein, ur NEGATIVE NEGATIVE mg/dL   Urobilinogen, UA 0.2 0.0 - 1.0 mg/dL   Nitrite NEGATIVE NEGATIVE   Leukocytes, UA NEGATIVE NEGATIVE  Protein / creatinine ratio, urine     Status: None   Collection Time: 04/08/14  8:54 PM  Result Value Ref Range   Creatinine, Urine 14.00 mg/dL   Total Protein, Urine <6 mg/dL   Protein Creatinine Ratio        0.00 - 0.15  Urine microscopic-add on     Status: Abnormal   Collection Time: 04/08/14  8:54 PM  Result Value Ref Range   Squamous Epithelial / LPF RARE RARE   WBC, UA  <3 WBC/hpf    NO FORMED ELEMENTS SEEN ON URINE MICROSCOPIC EXAMINATION   RBC / HPF 0-2 <3 RBC/hpf   Bacteria, UA FEW (A) RARE  CBC     Status: Abnormal   Collection Time: 04/08/14  9:40 PM  Result Value Ref Range   WBC 10.9 (H) 4.0 - 10.5 K/uL   RBC 3.88 3.87 - 5.11 MIL/uL   Hemoglobin 11.9 (L) 12.0 - 15.0 g/dL   HCT 16.134.9 (L) 09.636.0 - 04.546.0 %   MCV 89.9 78.0 - 100.0 fL   MCH 30.7 26.0 - 34.0 pg   MCHC 34.1 30.0 - 36.0 g/dL   RDW 40.913.3 81.111.5 - 91.415.5 %   Platelets 165 150 - 400 K/uL  Comprehensive metabolic panel     Status: Abnormal   Collection Time: 04/08/14  9:40 PM  Result Value Ref Range   Sodium 135 135 - 145 mmol/L   Potassium 3.7 3.5 - 5.1 mmol/L   Chloride 102 96 - 112 mmol/L   CO2 24 19 - 32 mmol/L   Glucose, Bld 103 (H) 70 - 99 mg/dL   BUN 6 6 - 23 mg/dL   Creatinine, Ser 7.820.49 (L) 0.50 - 1.10 mg/dL   Calcium 8.9 8.4 - 95.610.5 mg/dL   Total Protein 6.5 6.0 - 8.3 g/dL   Albumin 3.2 (L) 3.5 - 5.2 g/dL   AST 21 0 - 37 U/L   ALT 14 0 - 35 U/L   Alkaline Phosphatase 84 39 - 117 U/L   Total Bilirubin 0.6 0.3 - 1.2 mg/dL   GFR calc non Af Amer >90 >90 mL/min   GFR calc Af Amer >90 >90 mL/min   Anion gap 9 5 - 15     Assessment and Plan  33 y.o. G2P1001 at 549w0d IUP Normotensive Reactive NST Preterm Labor - No cervical change  Plan: Discharge to  home Provided reassurance Preterm labor precautions Call office to schedule an appointment.  Eino FarberWalidah Kennith GainN Karim, CNM

## 2014-04-09 DIAGNOSIS — Z3A34 34 weeks gestation of pregnancy: Secondary | ICD-10-CM | POA: Diagnosis not present

## 2014-04-09 DIAGNOSIS — O4703 False labor before 37 completed weeks of gestation, third trimester: Secondary | ICD-10-CM | POA: Diagnosis not present

## 2014-04-09 MED ORDER — TERBUTALINE SULFATE 1 MG/ML IJ SOLN
0.2500 mg | Freq: Once | INTRAMUSCULAR | Status: AC
  Administered 2014-04-09: 0.25 mg via SUBCUTANEOUS
  Filled 2014-04-09: qty 1

## 2014-04-09 NOTE — Discharge Instructions (Signed)

## 2014-04-12 LAB — OB RESULTS CONSOLE GBS: STREP GROUP B AG: NEGATIVE

## 2014-05-01 ENCOUNTER — Encounter (HOSPITAL_COMMUNITY): Payer: Self-pay | Admitting: *Deleted

## 2014-05-01 ENCOUNTER — Inpatient Hospital Stay (HOSPITAL_COMMUNITY)
Admission: AD | Admit: 2014-05-01 | Discharge: 2014-05-01 | Disposition: A | Discharge status: Home / Self Care | Payer: BLUE CROSS/BLUE SHIELD | Source: Ambulatory Visit | Attending: Obstetrics and Gynecology | Admitting: Obstetrics and Gynecology

## 2014-05-01 DIAGNOSIS — Z3A37 37 weeks gestation of pregnancy: Secondary | ICD-10-CM | POA: Insufficient documentation

## 2014-05-01 HISTORY — DX: Headache: R51

## 2014-05-01 HISTORY — DX: Headache, unspecified: R51.9

## 2014-05-01 HISTORY — DX: Anemia, unspecified: D64.9

## 2014-05-01 NOTE — MAU Note (Signed)
Contractions, lower abd cramping. Consistent all day. Denies bleeding or ROM

## 2014-05-01 NOTE — MAU Note (Signed)
Contractions every 4-10 minutes. Denies bright red vaginal bleeding.  Positive fetal movement.  Denies SROM/LOF  Denies any Complications of pregnancy  GBS negative per patient

## 2014-05-08 ENCOUNTER — Encounter (HOSPITAL_COMMUNITY): Payer: Self-pay | Admitting: *Deleted

## 2014-05-08 ENCOUNTER — Inpatient Hospital Stay (HOSPITAL_COMMUNITY): Payer: BLUE CROSS/BLUE SHIELD | Admitting: Anesthesiology

## 2014-05-08 ENCOUNTER — Inpatient Hospital Stay (HOSPITAL_COMMUNITY)
Admission: AD | Admit: 2014-05-08 | Discharge: 2014-05-10 | DRG: 775 | Disposition: A | Discharge status: Home / Self Care | Payer: BLUE CROSS/BLUE SHIELD | Source: Ambulatory Visit | Attending: Obstetrics and Gynecology | Admitting: Obstetrics and Gynecology

## 2014-05-08 DIAGNOSIS — IMO0001 Reserved for inherently not codable concepts without codable children: Secondary | ICD-10-CM

## 2014-05-08 DIAGNOSIS — Z3A38 38 weeks gestation of pregnancy: Secondary | ICD-10-CM | POA: Diagnosis present

## 2014-05-08 LAB — CBC
HCT: 35.7 % — ABNORMAL LOW (ref 36.0–46.0)
Hemoglobin: 12 g/dL (ref 12.0–15.0)
MCH: 30.2 pg (ref 26.0–34.0)
MCHC: 33.6 g/dL (ref 30.0–36.0)
MCV: 89.7 fL (ref 78.0–100.0)
PLATELETS: 140 10*3/uL — AB (ref 150–400)
RBC: 3.98 MIL/uL (ref 3.87–5.11)
RDW: 13.6 % (ref 11.5–15.5)
WBC: 12.4 10*3/uL — AB (ref 4.0–10.5)

## 2014-05-08 LAB — TYPE AND SCREEN
ABO/RH(D): A POS
Antibody Screen: NEGATIVE

## 2014-05-08 LAB — ABO/RH: ABO/RH(D): A POS

## 2014-05-08 MED ORDER — PHENYLEPHRINE 40 MCG/ML (10ML) SYRINGE FOR IV PUSH (FOR BLOOD PRESSURE SUPPORT)
80.0000 ug | PREFILLED_SYRINGE | INTRAVENOUS | Status: DC | PRN
  Filled 2014-05-08: qty 2
  Filled 2014-05-08: qty 20

## 2014-05-08 MED ORDER — LIDOCAINE HCL (PF) 1 % IJ SOLN
30.0000 mL | INTRAMUSCULAR | Status: DC | PRN
  Filled 2014-05-08: qty 30

## 2014-05-08 MED ORDER — PHENYLEPHRINE 40 MCG/ML (10ML) SYRINGE FOR IV PUSH (FOR BLOOD PRESSURE SUPPORT)
80.0000 ug | PREFILLED_SYRINGE | INTRAVENOUS | Status: DC | PRN
  Filled 2014-05-08: qty 2

## 2014-05-08 MED ORDER — FENTANYL 2.5 MCG/ML BUPIVACAINE 1/10 % EPIDURAL INFUSION (WH - ANES)
INTRAMUSCULAR | Status: DC | PRN
  Administered 2014-05-08: 14 mL/h via EPIDURAL

## 2014-05-08 MED ORDER — LACTATED RINGERS IV SOLN
500.0000 mL | Freq: Once | INTRAVENOUS | Status: AC
  Administered 2014-05-08: 500 mL via INTRAVENOUS

## 2014-05-08 MED ORDER — ONDANSETRON HCL 4 MG/2ML IJ SOLN: 4.0000 mg | Freq: Four times a day (QID) | INTRAMUSCULAR | Status: DC | PRN

## 2014-05-08 MED ORDER — EPHEDRINE 5 MG/ML INJ
10.0000 mg | INTRAVENOUS | Status: DC | PRN
  Filled 2014-05-08: qty 2

## 2014-05-08 MED ORDER — DIPHENHYDRAMINE HCL 50 MG/ML IJ SOLN: 12.5000 mg | INTRAMUSCULAR | Status: DC | PRN

## 2014-05-08 MED ORDER — OXYTOCIN 40 UNITS IN LACTATED RINGERS INFUSION - SIMPLE MED: 1.0000 m[IU]/min | INTRAVENOUS | Status: DC

## 2014-05-08 MED ORDER — FENTANYL 2.5 MCG/ML BUPIVACAINE 1/10 % EPIDURAL INFUSION (WH - ANES)
14.0000 mL/h | INTRAMUSCULAR | Status: DC | PRN
  Administered 2014-05-08: 14 mL/h via EPIDURAL
  Filled 2014-05-08: qty 125

## 2014-05-08 MED ORDER — ACETAMINOPHEN 325 MG PO TABS
650.0000 mg | ORAL_TABLET | ORAL | Status: DC | PRN
Start: 2014-05-08 — End: 2014-05-09

## 2014-05-08 MED ORDER — CITRIC ACID-SODIUM CITRATE 334-500 MG/5ML PO SOLN: 30.0000 mL | ORAL | Status: DC | PRN

## 2014-05-08 MED ORDER — OXYCODONE-ACETAMINOPHEN 5-325 MG PO TABS: 2.0000 | ORAL_TABLET | ORAL | Status: DC | PRN

## 2014-05-08 MED ORDER — FLEET ENEMA 7-19 GM/118ML RE ENEM: 1.0000 | ENEMA | RECTAL | Status: DC | PRN

## 2014-05-08 MED ORDER — OXYCODONE-ACETAMINOPHEN 5-325 MG PO TABS: 1.0000 | ORAL_TABLET | ORAL | Status: DC | PRN

## 2014-05-08 MED ORDER — LACTATED RINGERS IV SOLN
INTRAVENOUS | Status: DC
  Administered 2014-05-08 (×2): via INTRAVENOUS

## 2014-05-08 MED ORDER — OXYTOCIN BOLUS FROM INFUSION
500.0000 mL | INTRAVENOUS | Status: DC
  Administered 2014-05-08: 500 mL via INTRAVENOUS

## 2014-05-08 MED ORDER — OXYTOCIN 40 UNITS IN LACTATED RINGERS INFUSION - SIMPLE MED
62.5000 mL/h | INTRAVENOUS | Status: DC
  Administered 2014-05-09: 62.5 mL/h via INTRAVENOUS
  Filled 2014-05-08: qty 1000

## 2014-05-08 MED ORDER — LACTATED RINGERS IV SOLN: 500.0000 mL | INTRAVENOUS | Status: DC | PRN

## 2014-05-08 MED ORDER — LIDOCAINE HCL (PF) 1 % IJ SOLN
INTRAMUSCULAR | Status: DC | PRN
  Administered 2014-05-08 (×2): 8 mL

## 2014-05-08 NOTE — Anesthesia Preprocedure Evaluation (Signed)
Anesthesia Evaluation  Patient identified by MRN, date of birth, ID band Patient awake    Reviewed: Allergy & Precautions, H&P , NPO status , Patient's Chart, lab work & pertinent test results  Airway Mallampati: I  TM Distance: >3 FB Neck ROM: full    Dental no notable dental hx.    Pulmonary neg pulmonary ROS,    Pulmonary exam normal       Cardiovascular negative cardio ROS      Neuro/Psych negative psych ROS   GI/Hepatic negative GI ROS, Neg liver ROS,   Endo/Other  negative endocrine ROS  Renal/GU negative Renal ROS     Musculoskeletal   Abdominal Normal abdominal exam  (+)   Peds  Hematology   Anesthesia Other Findings   Reproductive/Obstetrics (+) Pregnancy                             Anesthesia Physical Anesthesia Plan  ASA: II  Anesthesia Plan: Epidural   Post-op Pain Management:    Induction:   Airway Management Planned:   Additional Equipment:   Intra-op Plan:   Post-operative Plan:   Informed Consent: I have reviewed the patients History and Physical, chart, labs and discussed the procedure including the risks, benefits and alternatives for the proposed anesthesia with the patient or authorized representative who has indicated his/her understanding and acceptance.     Plan Discussed with:   Anesthesia Plan Comments:         Anesthesia Quick Evaluation  

## 2014-05-08 NOTE — H&P (Signed)
Debbie French is a 34 y.o. female presenting for labor sxs.  Seen in office with ctxs every 4-5 minutes with Cx change to 4 cm.  Pregnancy uncomplicated.  GBS -. History OB History    Gravida Para Term Preterm AB TAB SAB Ectopic Multiple Living   2 1 1  0 0 0 0 0 0 1     Past Medical History  Diagnosis Date  . Anemia   . Headache    No past surgical history on file. Family History: family history is not on file. Social History:  reports that she has never smoked. She has never used smokeless tobacco. She reports that she does not drink alcohol or use illicit drugs.   Prenatal Transfer Tool  Maternal Diabetes: No Genetic Screening: Normal Maternal Ultrasounds/Referrals: Normal Fetal Ultrasounds or other Referrals:  None Maternal Substance Abuse:  No Significant Maternal Medications:  None Significant Maternal Lab Results:  None Other Comments:  None  ROS    There were no vitals taken for this visit. Exam Physical Exam   Cx 4/70/-2 Prenatal labs: ABO, Rh:   Antibody:   Rubella:   RPR:    HBsAg:    HIV:    GBS:     Assessment/Plan: IUP at term in active labor Anticipate SVD   Debbie French 05/08/2014, 12:54 PM

## 2014-05-08 NOTE — MAU Note (Signed)
Pt presents to MAU from Dr Vance GatherLowe's office for direct admission for labor. Awaiting bed at this time. Denies any vaginal bleeding or LOF

## 2014-05-08 NOTE — Anesthesia Procedure Notes (Signed)
Epidural Patient location during procedure: OB Start time: 05/08/2014 9:30 PM End time: 05/08/2014 9:34 PM  Staffing Anesthesiologist: Leilani AbleHATCHETT, Jaya Lapka Performed by: anesthesiologist   Preanesthetic Checklist Completed: patient identified, surgical consent, pre-op evaluation, timeout performed, IV checked, risks and benefits discussed and monitors and equipment checked  Epidural Patient position: sitting Prep: site prepped and draped and DuraPrep Patient monitoring: continuous pulse ox and blood pressure Approach: midline Location: L3-L4 Injection technique: LOR air  Needle:  Needle type: Tuohy  Needle gauge: 17 G Needle length: 9 cm and 9 Needle insertion depth: 5 cm cm Catheter type: closed end flexible Catheter size: 19 Gauge Catheter at skin depth: 10 cm Test dose: negative and Other  Assessment Sensory level: T9 Events: blood not aspirated, injection not painful, no injection resistance, negative IV test and no paresthesia  Additional Notes Reason for block:procedure for pain

## 2014-05-09 ENCOUNTER — Encounter (HOSPITAL_COMMUNITY): Payer: Self-pay | Admitting: *Deleted

## 2014-05-09 LAB — CBC
HCT: 32.6 % — ABNORMAL LOW (ref 36.0–46.0)
Hemoglobin: 11.1 g/dL — ABNORMAL LOW (ref 12.0–15.0)
MCH: 30.4 pg (ref 26.0–34.0)
MCHC: 34 g/dL (ref 30.0–36.0)
MCV: 89.3 fL (ref 78.0–100.0)
Platelets: 159 K/uL (ref 150–400)
RBC: 3.65 MIL/uL — ABNORMAL LOW (ref 3.87–5.11)
RDW: 13.5 % (ref 11.5–15.5)
WBC: 16.2 K/uL — ABNORMAL HIGH (ref 4.0–10.5)

## 2014-05-09 LAB — RPR: RPR Ser Ql: NONREACTIVE

## 2014-05-09 MED ORDER — MEASLES, MUMPS & RUBELLA VAC ~~LOC~~ INJ
0.5000 mL | INJECTION | Freq: Once | SUBCUTANEOUS | Status: DC
  Filled 2014-05-09: qty 0.5

## 2014-05-09 MED ORDER — ACETAMINOPHEN 325 MG PO TABS: 650.0000 mg | ORAL_TABLET | ORAL | Status: DC | PRN

## 2014-05-09 MED ORDER — SIMETHICONE 80 MG PO CHEW: 80.0000 mg | CHEWABLE_TABLET | ORAL | Status: DC | PRN

## 2014-05-09 MED ORDER — ZOLPIDEM TARTRATE 5 MG PO TABS: 5.0000 mg | ORAL_TABLET | Freq: Every evening | ORAL | Status: DC | PRN

## 2014-05-09 MED ORDER — DIBUCAINE 1 % RE OINT: 1.0000 "application " | TOPICAL_OINTMENT | RECTAL | Status: DC | PRN

## 2014-05-09 MED ORDER — ONDANSETRON HCL 4 MG/2ML IJ SOLN: 4.0000 mg | INTRAMUSCULAR | Status: DC | PRN

## 2014-05-09 MED ORDER — TETANUS-DIPHTH-ACELL PERTUSSIS 5-2.5-18.5 LF-MCG/0.5 IM SUSP: 0.5000 mL | Freq: Once | INTRAMUSCULAR | Status: DC

## 2014-05-09 MED ORDER — WITCH HAZEL-GLYCERIN EX PADS: 1.0000 "application " | MEDICATED_PAD | CUTANEOUS | Status: DC | PRN

## 2014-05-09 MED ORDER — OXYCODONE-ACETAMINOPHEN 5-325 MG PO TABS: 2.0000 | ORAL_TABLET | ORAL | Status: DC | PRN

## 2014-05-09 MED ORDER — IBUPROFEN 600 MG PO TABS
600.0000 mg | ORAL_TABLET | Freq: Four times a day (QID) | ORAL | Status: DC
  Administered 2014-05-09 – 2014-05-10 (×6): 600 mg via ORAL
  Filled 2014-05-09 (×6): qty 1

## 2014-05-09 MED ORDER — PRENATAL MULTIVITAMIN CH
1.0000 | ORAL_TABLET | Freq: Every day | ORAL | Status: DC
  Administered 2014-05-09 – 2014-05-10 (×2): 1 via ORAL
  Filled 2014-05-09 (×2): qty 1

## 2014-05-09 MED ORDER — DIPHENHYDRAMINE HCL 25 MG PO CAPS: 25.0000 mg | ORAL_CAPSULE | Freq: Four times a day (QID) | ORAL | Status: DC | PRN

## 2014-05-09 MED ORDER — LANOLIN HYDROUS EX OINT: TOPICAL_OINTMENT | CUTANEOUS | Status: DC | PRN

## 2014-05-09 MED ORDER — ONDANSETRON HCL 4 MG PO TABS: 4.0000 mg | ORAL_TABLET | ORAL | Status: DC | PRN

## 2014-05-09 MED ORDER — MEDROXYPROGESTERONE ACETATE 150 MG/ML IM SUSP: 150.0000 mg | INTRAMUSCULAR | Status: DC | PRN

## 2014-05-09 MED ORDER — BENZOCAINE-MENTHOL 20-0.5 % EX AERO
1.0000 "application " | INHALATION_SPRAY | CUTANEOUS | Status: DC | PRN
  Filled 2014-05-09: qty 56

## 2014-05-09 MED ORDER — OXYCODONE-ACETAMINOPHEN 5-325 MG PO TABS: 1.0000 | ORAL_TABLET | ORAL | Status: DC | PRN

## 2014-05-09 MED ORDER — SENNOSIDES-DOCUSATE SODIUM 8.6-50 MG PO TABS
2.0000 | ORAL_TABLET | ORAL | Status: DC
  Administered 2014-05-10: 2 via ORAL
  Filled 2014-05-09: qty 2

## 2014-05-09 NOTE — Anesthesia Postprocedure Evaluation (Signed)
Anesthesia Post Note  Patient: Debbie French  Procedure(s) Performed: * No procedures listed *  Anesthesia type: Epidural  Patient location: Mother/Baby  Post pain: Pain level controlled  Post assessment: Post-op Vital signs reviewed  Last Vitals:  Filed Vitals:   05/09/14 0645  BP: 119/86  Pulse: 88  Temp: 36.6 C  Resp: 18    Post vital signs: Reviewed  Level of consciousness:alert  Complications: No apparent anesthesia complications

## 2014-05-09 NOTE — Progress Notes (Signed)
Post Partum Day 1 Subjective: no complaints, up ad lib, voiding and tolerating PO, denies HA, no RUQ pain  Objective: Blood pressure 119/86, pulse 88, temperature 97.8 F (36.6 C), temperature source Oral, resp. rate 18, height 5\' 2"  (1.575 m), weight 159 lb (72.122 kg), SpO2 97 %, unknown if currently breastfeeding.  Physical Exam:  General: alert and cooperative Lochia: appropriate Uterine Fundus: firm Incision: healing well DVT Evaluation: No evidence of DVT seen on physical exam. Negative Homan's sign. No cords or calf tenderness. No significant calf/ankle edema.   Recent Labs  05/08/14 1943 05/09/14 0615  HGB 12.0 11.1*  HCT 35.7* 32.6*    Assessment/Plan: Plan for discharge tomorrow and Circumcision prior to discharge   LOS: 1 day   CURTIS,CAROL G 05/09/2014, 8:49 AM

## 2014-05-09 NOTE — Lactation Note (Addendum)
This note was copied from the chart of Boy Karl ItoMelissa Gainer. Lactation Consultation Note  P2, BF 6 weeks and had difficulty latching and has a history of mastitis x 2. This baby latches easily.  Baby latched in football upon entering.  Sucks and swallows observed. Mother knows how to hand express and has seen colostrum.   Reviewed cluster feeding. Mom encouraged to feed baby 8-12 times/24 hours and with feeding cues.  Mom made aware of O/P services, breastfeeding support groups, community resources, and our phone # for post-discharge questions.    Patient Name: Boy Karl ItoMelissa Sieling ZOXWR'UToday's Date: 05/09/2014 Reason for consult: Initial assessment   Maternal Data Has patient been taught Hand Expression?: Yes Does the patient have breastfeeding experience prior to this delivery?: Yes  Feeding Feeding Type: Breast Fed Length of feed: 20 min  LATCH Score/Interventions Latch: Grasps breast easily, tongue down, lips flanged, rhythmical sucking. (latched upon entering) Intervention(s): Breast massage  Audible Swallowing: A few with stimulation Intervention(s): Skin to skin;Hand expression  Type of Nipple: Everted at rest and after stimulation  Comfort (Breast/Nipple): Soft / non-tender     Hold (Positioning): No assistance needed to correctly position infant at breast. Intervention(s): Breastfeeding basics reviewed;Support Pillows;Position options;Skin to skin  LATCH Score: 8  Lactation Tools Discussed/Used     Consult Status Consult Status: Follow-up Date: 05/10/14 Follow-up type: In-patient    Dahlia ByesBerkelhammer, Marg Macmaster [facility] 05/09/2014, 12:33 PM

## 2014-05-10 MED ORDER — IBUPROFEN 600 MG PO TABS: 600.0000 mg | ORAL_TABLET | Freq: Four times a day (QID) | ORAL | Status: DC

## 2014-05-10 NOTE — Discharge Summary (Signed)
Obstetric Discharge Summary Reason for Admission: induction of labor Prenatal Procedures: ultrasound Intrapartum Procedures: spontaneous vaginal delivery Postpartum Procedures: none Complications-Operative and Postpartum: 2 degree perineal laceration HEMOGLOBIN  Date Value Ref Range Status  05/09/2014 11.1* 12.0 - 15.0 g/dL Final   HCT  Date Value Ref Range Status  05/09/2014 32.6* 36.0 - 46.0 % Final    Physical Exam:  General: alert and cooperative Lochia: appropriate Uterine Fundus: firm Incision: healing well DVT Evaluation: No evidence of DVT seen on physical exam. Negative Homan's sign. No cords or calf tenderness. No significant calf/ankle edema.  Discharge Diagnoses: Term Pregnancy-delivered  Discharge Information: Date: 05/10/2014 Activity: pelvic rest Diet: routine Medications: PNV and Ibuprofen Condition: stable Instructions: refer to practice specific booklet Discharge to: home   Newborn Data: Live born female  Birth Weight: 7 lb 9.3 oz (3440 g) APGAR: 8, 9  Home with mother.  Rafiq Bucklin G 05/10/2014, 8:25 AM

## 2014-05-10 NOTE — Lactation Note (Signed)
This note was copied from the chart of Debbie French [place]. Lactation Consultation Note  Mom reports pinching on her left nipple. I assisted her with alignment and bringing the baby closer. SHe reported some relief with these adjustments.  Recommended expressed breastmilk to her nipples. Comfort gels also given because she has blister on the left nipple.  Reviewed engorgement prevention and care of breasts if engorgement occurs.  Reminded her of outpatient support. Patient Name: Debbie French [place] NWGNF'AToday's Date: 05/10/2014     Maternal Data    Feeding    LATCH Score/Interventions                      Lactation Tools Discussed/Used     Consult Status      Soyla DryerJoseph, Amy Gothard 05/10/2014, 1:09 PM

## 2016-01-25 ENCOUNTER — Emergency Department (HOSPITAL_COMMUNITY)
Admission: EM | Admit: 2016-01-25 | Discharge: 2016-01-25 | Disposition: A | Discharge status: Home / Self Care | Payer: BLUE CROSS/BLUE SHIELD | Attending: Emergency Medicine | Admitting: Emergency Medicine

## 2016-01-25 ENCOUNTER — Encounter (HOSPITAL_COMMUNITY): Payer: Self-pay | Admitting: *Deleted

## 2016-01-25 DIAGNOSIS — X58XXXA Exposure to other specified factors, initial encounter: Secondary | ICD-10-CM | POA: Insufficient documentation

## 2016-01-25 DIAGNOSIS — Y929 Unspecified place or not applicable: Secondary | ICD-10-CM | POA: Insufficient documentation

## 2016-01-25 DIAGNOSIS — S8012XA Contusion of left lower leg, initial encounter: Secondary | ICD-10-CM | POA: Insufficient documentation

## 2016-01-25 DIAGNOSIS — Y999 Unspecified external cause status: Secondary | ICD-10-CM | POA: Insufficient documentation

## 2016-01-25 DIAGNOSIS — M79605 Pain in left leg: Secondary | ICD-10-CM

## 2016-01-25 DIAGNOSIS — Y939 Activity, unspecified: Secondary | ICD-10-CM | POA: Diagnosis not present

## 2016-01-25 NOTE — ED Provider Notes (Signed)
[facility]-EMERGENCY DEPT Provider Note   CSN: [id number] Arrival date & time: 01/25/16  0014     History   Chief Complaint Chief Complaint  Patient presents with  . Leg Pain    HPI Debbie French is a 35 y.o. female.  Patient noticed a small bruise on her left calf Friday evening. No known injury. Patient did have a massage on Friday that included her lower leg. She has not developed a bruise from prior massages.   The history is provided by the patient. No language interpreter was used.  Leg Pain   This is a new problem. The current episode started 6 to 12 hours ago. The problem has been gradually improving. The pain is present in the left lower leg. The quality of the pain is described as dull. The pain is mild. Pertinent negatives include no numbness, full range of motion, no stiffness, no tingling and no itching. She has tried rest for the symptoms. The treatment provided mild relief. There has been no history of extremity trauma.    Past Medical History:  Diagnosis Date  . Anemia   . Headache     Patient Active Problem List   Diagnosis Date Noted  . Active labor 05/08/2014    History reviewed. No pertinent surgical history.  OB History    Gravida Para Term Preterm AB Living   2 2 2  0 0 2   SAB TAB Ectopic Multiple Live Births   0 0 0 0 2       Home Medications    Prior to Admission medications   Medication Sig Start Date End Date Taking? Authorizing Provider  ibuprofen (ADVIL,MOTRIN) 600 MG tablet Take 1 tablet (600 mg total) by mouth every 6 (six) hours. 05/10/14   Julio Sicksarol Curtis, NP  Prenatal Vit-Fe Fumarate-FA (PRENATAL MULTIVITAMIN) TABS Take 1 tablet by mouth at bedtime.     Historical Provider, MD    Family History No family history on file.  Social History Social History  Substance Use Topics  . Smoking status: Never Smoker  . Smokeless tobacco: Never Used  . Alcohol use No     Allergies   Amoxicillin and Penicillins   Review of  Systems Review of Systems  Musculoskeletal: Negative for stiffness.       Calf bruise  Skin: Negative for itching.  Neurological: Negative for tingling and numbness.  All other systems reviewed and are negative.    Physical Exam Updated Vital Signs BP 124/89 (BP Location: Left Arm)   Pulse 92   Temp 97.8 F (36.6 C) (Oral)   Resp 18   Ht 5\' 2"  (1.575 m)   Wt 54.2 kg   SpO2 100%   BMI 21.86 kg/m   Physical Exam  Constitutional: She is oriented to person, place, and time. She appears well-developed and well-nourished.  HENT:  Head: Atraumatic.  Eyes: Conjunctivae are normal.  Neck: Neck supple.  Cardiovascular: Normal rate and regular rhythm.   Pulmonary/Chest: Effort normal and breath sounds normal.  Abdominal: Soft. Bowel sounds are normal.  Musculoskeletal: Normal range of motion. She exhibits no edema or deformity.  Small superficial bruise noted lower aspect of posterior left calf. Minimal tenderness. Negative Homans' sign. No calf swelling. No tenderness to deep venous structures.   Neurological: She is alert and oriented to person, place, and time.  Skin: Skin is warm and dry.  Psychiatric: She has a normal mood and affect.  Nursing note and vitals reviewed.    ED Treatments /  Results  Labs (all labs ordered are listed, but only abnormal results are displayed) Labs Reviewed - No data to display  EKG  EKG Interpretation None       Radiology No results found.  Procedures Procedures (including critical care time)  Medications Ordered in ED Medications - No data to display   Initial Impression / Assessment and Plan / ED Course  I have reviewed the triage vital signs and the nursing notes.  Pertinent labs & imaging results that were available during my care of the patient were reviewed by me and considered in my medical decision making (see chart for details).  Clinical Course   Patient with small superficial bruise to left lower calf. No shortness  of breath, negative Homans' sign. On birth control, but no other clotting risk factors. Low suspicion of DVT. Conservative therapy recommended and discussed. Patient will be discharged home & is agreeable with above plan. Returns precautions discussed. Pt appears safe for discharge.    Final Clinical Impressions(s) / ED Diagnoses   Final diagnoses:  Pain of left lower extremity    New Prescriptions New Prescriptions   No medications on file     Felicie Mornavid Zaelynn Fuchs, NP 01/25/16 0126    Glynn OctaveStephen Rancour, MD 01/25/16 [id number]

## 2016-01-25 NOTE — ED Triage Notes (Signed)
The pt just notice a bruise on her lt posterior lower leg at 1900 she does not remember if she injuured it  lmp none birth control pills

## 2016-01-25 NOTE — ED Notes (Signed)
Pt understood dc material. NAD noted. 

## 2016-12-29 ENCOUNTER — Ambulatory Visit (HOSPITAL_COMMUNITY)
Admission: AD | Admit: 2016-12-29 | Discharge: 2016-12-30 | Disposition: A | Discharge status: Home / Self Care | Payer: BLUE CROSS/BLUE SHIELD | Source: Ambulatory Visit | Attending: Obstetrics and Gynecology | Admitting: Obstetrics and Gynecology

## 2016-12-29 ENCOUNTER — Encounter (HOSPITAL_COMMUNITY): Payer: Self-pay | Admitting: *Deleted

## 2016-12-29 DIAGNOSIS — O26891 Other specified pregnancy related conditions, first trimester: Secondary | ICD-10-CM

## 2016-12-29 DIAGNOSIS — O009 Unspecified ectopic pregnancy without intrauterine pregnancy: Secondary | ICD-10-CM | POA: Insufficient documentation

## 2016-12-29 DIAGNOSIS — N838 Other noninflammatory disorders of ovary, fallopian tube and broad ligament: Secondary | ICD-10-CM | POA: Diagnosis not present

## 2016-12-29 DIAGNOSIS — R109 Unspecified abdominal pain: Secondary | ICD-10-CM

## 2016-12-29 DIAGNOSIS — O3680X0 Pregnancy with inconclusive fetal viability, not applicable or unspecified: Secondary | ICD-10-CM

## 2016-12-29 NOTE — MAU Note (Signed)
PT SAYS SHE HAD POSITIVE  HPT ON Sunday.  HAD SPOTTING ON Monday  WHEN  WIPED .   TODAY WENT  TO OFFICE  FOR  LABS .  TONIGHT  STARTED  SPOTTING   AGAIN .    TOOK PROGESTERONE  PILL AT  830PM  .    THEN AT 930PM- SHE FELT  SEVER   PAIN ON LEFT SIDE  AND BACK  .  HAD  DIARRHEA  YESTERDAY AND  TODAY-  LAST BM WAS 1030PM.      NO VOMITING .   IN TRIAGE  -  HAS  PAD ON - WITH  1 RED  SPOT.

## 2016-12-30 ENCOUNTER — Inpatient Hospital Stay (HOSPITAL_COMMUNITY): Payer: BLUE CROSS/BLUE SHIELD | Admitting: Anesthesiology

## 2016-12-30 ENCOUNTER — Encounter (HOSPITAL_COMMUNITY): Payer: Self-pay | Admitting: *Deleted

## 2016-12-30 ENCOUNTER — Inpatient Hospital Stay (HOSPITAL_COMMUNITY): Payer: BLUE CROSS/BLUE SHIELD

## 2016-12-30 ENCOUNTER — Encounter (HOSPITAL_COMMUNITY)
Admission: AD | Disposition: A | Discharge status: Home / Self Care | Payer: Self-pay | Source: Ambulatory Visit | Attending: Obstetrics and Gynecology

## 2016-12-30 DIAGNOSIS — O3680X0 Pregnancy with inconclusive fetal viability, not applicable or unspecified: Secondary | ICD-10-CM

## 2016-12-30 DIAGNOSIS — O009 Unspecified ectopic pregnancy without intrauterine pregnancy: Secondary | ICD-10-CM | POA: Diagnosis not present

## 2016-12-30 HISTORY — PX: UNILATERAL SALPINGECTOMY: SHX6160

## 2016-12-30 HISTORY — PX: LAPAROSCOPY: SHX197

## 2016-12-30 LAB — CBC
HCT: 40.2 % (ref 36.0–46.0)
HEMOGLOBIN: 13.9 g/dL (ref 12.0–15.0)
MCH: 31 pg (ref 26.0–34.0)
MCHC: 34.6 g/dL (ref 30.0–36.0)
MCV: 89.7 fL (ref 78.0–100.0)
Platelets: 183 10*3/uL (ref 150–400)
RBC: 4.48 MIL/uL (ref 3.87–5.11)
RDW: 12.9 % (ref 11.5–15.5)
WBC: 9.6 10*3/uL (ref 4.0–10.5)

## 2016-12-30 LAB — COMPREHENSIVE METABOLIC PANEL
ALK PHOS: 49 U/L (ref 38–126)
ALT: 20 U/L (ref 14–54)
AST: 23 U/L (ref 15–41)
Albumin: 4.5 g/dL (ref 3.5–5.0)
Anion gap: 9 (ref 5–15)
BILIRUBIN TOTAL: 1 mg/dL (ref 0.3–1.2)
BUN: 14 mg/dL (ref 6–20)
CALCIUM: 9.2 mg/dL (ref 8.9–10.3)
CO2: 22 mmol/L (ref 22–32)
CREATININE: 0.61 mg/dL (ref 0.44–1.00)
Chloride: 105 mmol/L (ref 101–111)
GFR calc Af Amer: >60 mL/min (ref >60)
Glucose, Bld: 121 mg/dL — ABNORMAL HIGH (ref 65–99)
Potassium: 4.1 mmol/L (ref 3.5–5.1)
Sodium: 136 mmol/L (ref 135–145)
TOTAL PROTEIN: 7.6 g/dL (ref 6.5–8.1)

## 2016-12-30 LAB — POCT PREGNANCY, URINE: PREG TEST UR: POSITIVE — AB

## 2016-12-30 LAB — HCG, QUANTITATIVE, PREGNANCY: hCG, Beta Chain, Quant, S: 56 m[IU]/mL — ABNORMAL HIGH (ref <5)

## 2016-12-30 LAB — URINALYSIS, ROUTINE W REFLEX MICROSCOPIC
Bilirubin Urine: NEGATIVE
Glucose, UA: NEGATIVE mg/dL
Ketones, ur: NEGATIVE mg/dL
LEUKOCYTES UA: NEGATIVE
NITRITE: NEGATIVE
PH: 6 (ref 5.0–8.0)
Protein, ur: NEGATIVE mg/dL
Specific Gravity, Urine: 1.01 (ref 1.005–1.030)

## 2016-12-30 LAB — WET PREP, GENITAL
CLUE CELLS WET PREP: NONE SEEN
SPERM: NONE SEEN
TRICH WET PREP: NONE SEEN
Yeast Wet Prep HPF POC: NONE SEEN

## 2016-12-30 LAB — PREPARE RBC (CROSSMATCH)

## 2016-12-30 SURGERY — LAPAROSCOPY OPERATIVE
Anesthesia: General | Site: Abdomen

## 2016-12-30 MED ORDER — FENTANYL CITRATE (PF) 250 MCG/5ML IJ SOLN
INTRAMUSCULAR | Status: AC
  Filled 2016-12-30: qty 5

## 2016-12-30 MED ORDER — MIDAZOLAM HCL 5 MG/5ML IJ SOLN
INTRAMUSCULAR | Status: DC | PRN
  Administered 2016-12-30: 2 mg via INTRAVENOUS

## 2016-12-30 MED ORDER — LIDOCAINE HCL (CARDIAC) 20 MG/ML IV SOLN
INTRAVENOUS | Status: DC | PRN
  Administered 2016-12-30: 30 mg via INTRAVENOUS

## 2016-12-30 MED ORDER — LIDOCAINE HCL (CARDIAC) 20 MG/ML IV SOLN
INTRAVENOUS | Status: AC
  Filled 2016-12-30: qty 5

## 2016-12-30 MED ORDER — ACETAMINOPHEN 10 MG/ML IV SOLN
1000.0000 mg | Freq: Once | INTRAVENOUS | Status: AC
  Administered 2016-12-30: 1000 mg via INTRAVENOUS

## 2016-12-30 MED ORDER — ACETAMINOPHEN 10 MG/ML IV SOLN
INTRAVENOUS | Status: AC
  Filled 2016-12-30: qty 100

## 2016-12-30 MED ORDER — MEPERIDINE HCL 25 MG/ML IJ SOLN
INTRAMUSCULAR | Status: AC
  Filled 2016-12-30: qty 1

## 2016-12-30 MED ORDER — ONDANSETRON HCL 4 MG/2ML IJ SOLN
INTRAMUSCULAR | Status: DC | PRN
  Administered 2016-12-30: 4 mg via INTRAVENOUS

## 2016-12-30 MED ORDER — PROPOFOL 10 MG/ML IV BOLUS
INTRAVENOUS | Status: DC | PRN
  Administered 2016-12-30: 200 mg via INTRAVENOUS

## 2016-12-30 MED ORDER — DEXAMETHASONE SODIUM PHOSPHATE 10 MG/ML IJ SOLN
INTRAMUSCULAR | Status: AC
  Filled 2016-12-30: qty 1

## 2016-12-30 MED ORDER — PROPOFOL 10 MG/ML IV BOLUS
INTRAVENOUS | Status: AC
  Filled 2016-12-30: qty 20

## 2016-12-30 MED ORDER — SUGAMMADEX SODIUM 200 MG/2ML IV SOLN
INTRAVENOUS | Status: DC | PRN
  Administered 2016-12-30: 115 mg via INTRAVENOUS

## 2016-12-30 MED ORDER — ONDANSETRON HCL 4 MG/2ML IJ SOLN
INTRAMUSCULAR | Status: AC
  Filled 2016-12-30: qty 2

## 2016-12-30 MED ORDER — LACTATED RINGERS IV SOLN: INTRAVENOUS | Status: DC

## 2016-12-30 MED ORDER — LACTATED RINGERS IV SOLN
INTRAVENOUS | Status: DC
  Administered 2016-12-30 (×2): via INTRAVENOUS

## 2016-12-30 MED ORDER — ROCURONIUM BROMIDE 100 MG/10ML IV SOLN
INTRAVENOUS | Status: DC | PRN
  Administered 2016-12-30: 10 mg via INTRAVENOUS

## 2016-12-30 MED ORDER — HYDROMORPHONE HCL 1 MG/ML IJ SOLN: 0.2500 mg | INTRAMUSCULAR | Status: DC | PRN

## 2016-12-30 MED ORDER — MIDAZOLAM HCL 2 MG/2ML IJ SOLN
INTRAMUSCULAR | Status: AC
  Filled 2016-12-30: qty 2

## 2016-12-30 MED ORDER — MEPERIDINE HCL 25 MG/ML IJ SOLN
6.2500 mg | INTRAMUSCULAR | Status: DC | PRN
  Administered 2016-12-30: 6.25 mg via INTRAVENOUS
  Administered 2016-12-30: 12.5 mg via INTRAVENOUS
  Administered 2016-12-30: 6.25 mg via INTRAVENOUS

## 2016-12-30 MED ORDER — FENTANYL CITRATE (PF) 100 MCG/2ML IJ SOLN
INTRAMUSCULAR | Status: AC
  Filled 2016-12-30: qty 2

## 2016-12-30 MED ORDER — SUCCINYLCHOLINE CHLORIDE 20 MG/ML IJ SOLN
INTRAMUSCULAR | Status: DC | PRN
  Administered 2016-12-30: 100 mg via INTRAVENOUS

## 2016-12-30 MED ORDER — SODIUM CHLORIDE 0.9 % IR SOLN
Status: DC | PRN
  Administered 2016-12-30: 3000 mL

## 2016-12-30 MED ORDER — PROMETHAZINE HCL 25 MG/ML IJ SOLN: 6.2500 mg | INTRAMUSCULAR | Status: DC | PRN

## 2016-12-30 MED ORDER — DEXAMETHASONE SODIUM PHOSPHATE 10 MG/ML IJ SOLN
INTRAMUSCULAR | Status: DC | PRN
  Administered 2016-12-30: 10 mg via INTRAVENOUS

## 2016-12-30 MED ORDER — ROCURONIUM BROMIDE 100 MG/10ML IV SOLN
INTRAVENOUS | Status: AC
  Filled 2016-12-30: qty 1

## 2016-12-30 MED ORDER — SUGAMMADEX SODIUM 200 MG/2ML IV SOLN
INTRAVENOUS | Status: AC
  Filled 2016-12-30: qty 2

## 2016-12-30 MED ORDER — FENTANYL CITRATE (PF) 100 MCG/2ML IJ SOLN
INTRAMUSCULAR | Status: DC | PRN
  Administered 2016-12-30: 100 ug via INTRAVENOUS
  Administered 2016-12-30: 50 ug via INTRAVENOUS
  Administered 2016-12-30: 100 ug via INTRAVENOUS
  Administered 2016-12-30 (×2): 50 ug via INTRAVENOUS

## 2016-12-30 SURGICAL SUPPLY — 32 items
CABLE HIGH FREQUENCY MONO STRZ (ELECTRODE) IMPLANT
CATH ROBINSON RED A/P 16FR (CATHETERS) IMPLANT
DERMABOND ADVANCED (GAUZE/BANDAGES/DRESSINGS) ×1
DERMABOND ADVANCED .7 DNX12 (GAUZE/BANDAGES/DRESSINGS) ×2 IMPLANT
DRSG OPSITE POSTOP 3X4 (GAUZE/BANDAGES/DRESSINGS) ×3 IMPLANT
GLOVE BIO SURGEON STRL SZ 6.5 (GLOVE) ×3 IMPLANT
GLOVE BIOGEL PI IND STRL 6.5 (GLOVE) ×2 IMPLANT
GLOVE BIOGEL PI IND STRL 7.0 (GLOVE) ×4 IMPLANT
GLOVE BIOGEL PI INDICATOR 6.5 (GLOVE) ×1
GLOVE BIOGEL PI INDICATOR 7.0 (GLOVE) ×2
GOWN STRL REUS W/TWL LRG LVL3 (GOWN DISPOSABLE) ×6 IMPLANT
LIGASURE LAP L-HOOKWIRE 5 44CM (INSTRUMENTS) ×3 IMPLANT
LIGASURE VESSEL 5MM BLUNT TIP (ELECTROSURGICAL) ×3 IMPLANT
NEEDLE INSUFFLATION 120MM (ENDOMECHANICALS) ×3 IMPLANT
NS IRRIG 1000ML POUR BTL (IV SOLUTION) ×3 IMPLANT
PACK LAPAROSCOPY BASIN (CUSTOM PROCEDURE TRAY) ×3 IMPLANT
PACK TRENDGUARD 450 HYBRID PRO (MISCELLANEOUS) ×2 IMPLANT
PACK TRENDGUARD 600 HYBRD PROC (MISCELLANEOUS) IMPLANT
POUCH SPECIMEN RETRIEVAL 10MM (ENDOMECHANICALS) ×3 IMPLANT
PROTECTOR NERVE ULNAR (MISCELLANEOUS) ×3 IMPLANT
SCISSORS LAP 5X35 DISP (ENDOMECHANICALS) IMPLANT
SET IRRIG TUBING LAPAROSCOPIC (IRRIGATION / IRRIGATOR) ×3 IMPLANT
SLEEVE XCEL OPT CAN 5 100 (ENDOMECHANICALS) ×3 IMPLANT
SUT VICRYL 0 UR6 27IN ABS (SUTURE) ×3 IMPLANT
SUT VICRYL RAPIDE 4/0 PS 2 (SUTURE) ×6 IMPLANT
TOWEL OR 17X24 6PK STRL BLUE (TOWEL DISPOSABLE) ×6 IMPLANT
TRENDGUARD 450 HYBRID PRO PACK (MISCELLANEOUS) ×3
TRENDGUARD 600 HYBRID PROC PK (MISCELLANEOUS)
TROCAR HASSON GELL 12X100 (TROCAR) ×3 IMPLANT
TROCAR XCEL NON-BLD 11X100MML (ENDOMECHANICALS) IMPLANT
TROCAR XCEL NON-BLD 5MMX100MML (ENDOMECHANICALS) ×3 IMPLANT
WARMER LAPAROSCOPE (MISCELLANEOUS) ×3 IMPLANT

## 2016-12-30 NOTE — Anesthesia Postprocedure Evaluation (Signed)
Anesthesia Post Note  Patient: Debbie French  Procedure(s) Performed: LAPAROSCOPY OPERATIVE (N/A Abdomen) UNILATERAL SALPINGECTOMY (Left Abdomen)     Patient location during evaluation: PACU Anesthesia Type: General Level of consciousness: awake and alert Pain management: pain level controlled Vital Signs Assessment: post-procedure vital signs reviewed and stable Respiratory status: spontaneous breathing, nonlabored ventilation, respiratory function stable and patient connected to nasal cannula oxygen Cardiovascular status: blood pressure returned to baseline and stable Postop Assessment: no apparent nausea or vomiting Anesthetic complications: no    Last Vitals:  Vitals:   12/30/16 0530 12/30/16 0545  BP: 123/79 (!) 129/99  Pulse: (!) 118 (!) 113  Resp: 17 18  Temp:  36.7 C  SpO2: 99% 99%    Last Pain:  Vitals:   12/30/16 0600  TempSrc:   PainSc: 3    Pain Goal:                 Shelton SilvasKevin D Trejon Duford

## 2016-12-30 NOTE — Anesthesia Preprocedure Evaluation (Addendum)
Anesthesia Evaluation  Patient identified by MRN, date of birth, ID band Patient awake    Reviewed: Allergy & Precautions, NPO status , Patient's Chart, lab work & pertinent test results  Airway Mallampati: I  TM Distance: >3 FB Neck ROM: Full    Dental  (+) Teeth Intact, Dental Advisory Given   Pulmonary neg pulmonary ROS,    breath sounds clear to auscultation       Cardiovascular negative cardio ROS   Rhythm:Regular Rate:Normal     Neuro/Psych  Headaches,    GI/Hepatic negative GI ROS, Neg liver ROS,   Endo/Other  negative endocrine ROS  Renal/GU negative Renal ROS     Musculoskeletal negative musculoskeletal ROS (+)   Abdominal   Peds  Hematology  (+) anemia ,   Anesthesia Other Findings   Reproductive/Obstetrics                            Anesthesia Physical Anesthesia Plan  ASA: II and emergent  Anesthesia Plan: General   Post-op Pain Management:    Induction: Intravenous, Rapid sequence and Cricoid pressure planned  PONV Risk Score and Plan: 4 or greater and Ondansetron, Dexamethasone, Midazolam and Scopolamine patch - Pre-op  Airway Management Planned: Oral ETT  Additional Equipment:   Intra-op Plan:   Post-operative Plan: Extubation in OR  Informed Consent: I have reviewed the patients History and Physical, chart, labs and discussed the procedure including the risks, benefits and alternatives for the proposed anesthesia with the patient or authorized representative who has indicated his/her understanding and acceptance.     Plan Discussed with: CRNA  Anesthesia Plan Comments:        Anesthesia Quick Evaluation

## 2016-12-30 NOTE — Anesthesia Procedure Notes (Signed)
Procedure Name: Intubation Date/Time: 12/30/2016 2:40 AM Performed by: Alvy Bimler, CRNA Pre-anesthesia Checklist: Patient identified, Patient being monitored, Timeout performed, Emergency Drugs available and Suction available Patient Re-evaluated:Patient Re-evaluated prior to induction Oxygen Delivery Method: Circle System Utilized Preoxygenation: Pre-oxygenation with 100% oxygen Induction Type: IV induction and Cricoid Pressure applied Ventilation: Mask ventilation without difficulty Laryngoscope Size: Mac and 3 Grade View: Grade II Tube type: Oral Tube size: 7.0 mm Number of attempts: 1 Airway Equipment and Method: stylet Placement Confirmation: ETT inserted through vocal cords under direct vision,  positive ETCO2 and breath sounds checked- equal and bilateral Secured at: 21 cm Tube secured with: Tape Dental Injury: Teeth and Oropharynx as per pre-operative assessment

## 2016-12-30 NOTE — Op Note (Signed)
DATE OF PROCEDURE: 12/30/16  PREOPERATIVE DIAGNOSIS: Ectopic pregnancy , ruptured  POSTOPERATIVE DIAGNOSIS: Same  PROCEDURE PERFORMED: LSC left salpingectomy  SURGEON: Belva AgeeElise Tashai Catino, MD  ANESTHESIA: Spinal  Complications: None  EBL: Minimal  Findings: normal uterus, normal bilateral ovaries. Normal right tube. Left sided ruptured ectopic pregnancy  DESCRIPTION OF PROCEDURE: After informed consent was signed (please see H&P for consent), the patient was taken to the operative suite and placed in the dorsal supine position. She had her abdomen and vagina appropriately prepped and draped and bladder emptied sterilely. A foley was placed. A 11mm skin incision was made at the Umbilicus and the fascia was incised sharply via Haussan technique. Fascia tagged. Entry into the abdominal cavity was visualized the scope upon direct entry. Insufflation performed under direct visualization.  The abdomen was noted to have minimal hemoperitoneum. Abdominal survey done and photos taken with above findings.  Suprapubic port placed after incising a 5mm skin incision with a scalpel. Trochar placed under direct viusalization. The left tube was noted to have a rupturing ectopic pregnancy with old clot adherent. The tube was followed until the fimbriae were visualized. The fallopian tube with the ectopic pregnancy was grasped and ligasure used to take sequential bites down the tube until the cornua was reached. The tube was transected and brought out through the large port. Good hemostasis was noted. The pressure was taken down to 2 and hemostasis continued. All ports removed under visualization. Umb fasica closed with one figure of eight. Skin closed with 4'0 vicryl. Blood loss was minimal. The patient was taken to the recovery room in stable condition. The patient will be discharged today after PACU recovery.

## 2016-12-30 NOTE — Transfer of Care (Signed)
Immediate Anesthesia Transfer of Care Note  Patient: Debbie French  Procedure(s) Performed: LAPAROSCOPY OPERATIVE (N/A Abdomen) UNILATERAL SALPINGECTOMY (Left Abdomen)  Patient Location: PACU  Anesthesia Type:General  Level of Consciousness: awake, alert  and oriented  Airway & Oxygen Therapy: Patient Spontanous Breathing and Patient connected to nasal cannula oxygen  Post-op Assessment: Report given to RN and Post -op Vital signs reviewed and stable  Post vital signs: Reviewed and stable  Last Vitals:  Vitals:   12/29/16 2312 12/30/16 0113  BP: (!) 152/95 133/85  Pulse: (!) 131 (!) 128  Temp: 36.9 C     Last Pain:  Vitals:   12/29/16 2312  TempSrc: Oral  PainSc:          Complications: No apparent anesthesia complications

## 2016-12-30 NOTE — MAU Provider Note (Signed)
History     CSN: [id number]  Arrival date and time: 12/29/16 2253   First Provider Initiated Contact with Patient 12/30/16 0025      Chief Complaint  Patient presents with  . Abdominal Pain   HPI  Ms. Debbie French is a 36 y.o. G2P2002 at 4.[redacted] wks gestation by LMP presenting to MAU with complaints of (+) HPT pn Sunday, spotting on Monday. She was seen in the office today for lab work. She took her Progesterone pill tonight at 2030 and started having red spotting tonight. She has pain on her left side and back. She reports having diarrhea yesterday and today; last BM was 2230. She denies vomiting. She reports being very anxious.  Past Medical History:  Diagnosis Date  . Anemia   . Headache     History reviewed. No pertinent surgical history.  History reviewed. No pertinent family history.  Social History   Tobacco Use  . Smoking status: Never Smoker  . Smokeless tobacco: Never Used  Substance Use Topics  . Alcohol use: No  . Drug use: No    Allergies:  Allergies  Allergen Reactions  . Amoxicillin Hives  . Penicillins Hives    Medications Prior to Admission  Medication Sig Dispense Refill Last Dose  . ibuprofen (ADVIL,MOTRIN) 200 MG tablet Take 200 mg by mouth every 6 (six) hours as needed for headache.   Past Month at Unknown time  . ibuprofen (ADVIL,MOTRIN) 600 MG tablet Take 1 tablet (600 mg total) by mouth every 6 (six) hours. 30 tablet 1 Past Month at Unknown time  . Prenatal Vit-Fe Fumarate-FA (PRENATAL MULTIVITAMIN) TABS Take 1 tablet by mouth at bedtime.    Past Week at Unknown time  . progesterone (PROMETRIUM) 200 MG capsule Take 200 mg by mouth daily.   12/29/2016 at Unknown time    Review of Systems  Constitutional: Negative.   HENT: Negative.   Eyes: Negative.   Respiratory: Negative.   Cardiovascular: Negative.   Gastrointestinal: Positive for abdominal pain and diarrhea.  Endocrine: Negative.   Genitourinary: Positive for pelvic pain and  vaginal bleeding.  Musculoskeletal: Positive for back pain.  Skin: Negative.   Allergic/Immunologic: Negative.   Neurological: Negative.   Hematological: Negative.   Psychiatric/Behavioral: Negative.    Physical Exam   Blood pressure (!) 152/95, pulse (!) 131, temperature 98.4 F (36.9 C), temperature source Oral, height 5\' 2"  (1.575 m), weight 126 lb 12 oz (57.5 kg), last menstrual period 11/30/2016.  Physical Exam  MAU Course  Procedures  MDM CCUA CBC CMP HCG Type Scren Wet Prep GC/CT HIV  *Consult with Dr. Elon SpannerLeger @ 0100 - notified of patient's complaints, assessments, lab & U/S results, coming to discuss POC with patient Dr. Elon SpannerLeger here @ [phone]  Results for orders placed or performed during the hospital encounter of 12/29/16 (from the past 24 hour(s))  hCG, quantitative, pregnancy     Status: Abnormal   Collection Time: 12/29/16 11:36 PM  Result Value Ref Range   hCG, Beta Chain, Quant, S 56 (H) <5 mIU/mL    [place] Ob Comp Less 14 Wks  Result Date: 12/30/2016 CLINICAL DATA:  Severe pain with spotting EXAM: OBSTETRIC <14 WK US AND TRANSVAGINAL OB US TECHNIQUE: Both transabdominal and transvaginal ultrasound examinations were performed for complete evaluation of the gestation as well as the maternal uterus, adnexal regions, and pelvic cul-de-sac. Transvaginal technique was performed to assess early pregnancy. COMPARISON:  None. FINDINGS: Intrauterine gestational sac: Not visible Yolk sac:  Not  visible Embryo:  Not visible Cardiac Activity: Not visible Maternal uterus/adnexae: Moderate free fluid in the pelvis. Right ovary measures 3 x 2.4 x 2.2 cm. Left ovary measures 1.4 x 2.8 x 1.2 cm. Heterogeneous collection within the left at adnexa, slightly tubular in configuration. Small cystic area with surrounding increased echogenicity, image number 42 and 43. IMPRESSION: 1. No intrauterine pregnancy visible 2. Complex collection in the left adnexa, suspicious for adnexal hematoma. Tiny  cystic area with adjacent increased echogenicity in the left adnexa, separate from the ovary. In the absence of intrauterine pregnancy, collective findings are suspicious for ectopic pregnancy, possibly ruptured given the moderate free fluid. Critical Value/emergent results were called by telephone at the time of interpretation on 12/30/2016 at 12:58 am [facility] Dr. Belva AgeeELISE LEGER , who verbally acknowledged these results. Electronically Signed   By: Jasmine PangKim  Fujinaga M.D.   On: 12/30/2016 00:59   [place] Ob Transvaginal  Result Date: 12/30/2016 CLINICAL DATA:  Severe pain with spotting EXAM: OBSTETRIC <14 WK US AND TRANSVAGINAL OB US TECHNIQUE: Both transabdominal and transvaginal ultrasound examinations were performed for complete evaluation of the gestation as well as the maternal uterus, adnexal regions, and pelvic cul-de-sac. Transvaginal technique was performed to assess early pregnancy. COMPARISON:  None. FINDINGS: Intrauterine gestational sac: Not visible Yolk sac:  Not visible Embryo:  Not visible Cardiac Activity: Not visible Maternal uterus/adnexae: Moderate free fluid in the pelvis. Right ovary measures 3 x 2.4 x 2.2 cm. Left ovary measures 1.4 x 2.8 x 1.2 cm. Heterogeneous collection within the left at adnexa, slightly tubular in configuration. Small cystic area with surrounding increased echogenicity, image number 42 and 43. IMPRESSION: 1. No intrauterine pregnancy visible 2. Complex collection in the left adnexa, suspicious for adnexal hematoma. Tiny cystic area with adjacent increased echogenicity in the left adnexa, separate from the ovary. In the absence of intrauterine pregnancy, collective findings are suspicious for ectopic pregnancy, possibly ruptured given the moderate free fluid. Critical Value/emergent results were called by telephone at the time of interpretation on 12/30/2016 at 12:58 am [facility] Dr. Belva AgeeELISE LEGER , who verbally acknowledged these results. Electronically Signed   By:  Jasmine PangKim  Fujinaga M.D.   On: 12/30/2016 00:59   Assessment and Plan  Dr. Elon SpannerLeger here @ [phone] -- care assumed by her at that time See her H&P for further documentation  Raelyn Moraolitta Djuan Talton, MSN, CNM 12/30/2016, 1:16 AM

## 2016-12-30 NOTE — Brief Op Note (Signed)
12/29/2016 - 12/30/2016  3:41 AM  PATIENT:  Shari ProwsMelissa A Noda  36 y.o. female  PRE-OPERATIVE DIAGNOSIS:  ectopic  POST-OPERATIVE DIAGNOSIS:  ectopic  PROCEDURE:  Procedure(s): LAPAROSCOPY OPERATIVE (N/A) UNILATERAL SALPINGECTOMY (Left)  SURGEON:  Surgeon(s) and Role:    * Myrissa Chipley, Madelaine EtienneElise Jennifer, MD - Primary  PHYSICIAN ASSISTANT:   ASSISTANTS: none   ANESTHESIA:   general  EBL:   Minimal   BLOOD ADMINISTERED:none  DRAINS: Urinary Catheter (Foley)   LOCAL MEDICATIONS USED:  NONE  SPECIMEN:  Source of Specimen:  left tube with ectopic pregnancy  DISPOSITION OF SPECIMEN:  PATHOLOGY  COUNTS:  YES  TOURNIQUET:  * No tourniquets in log *  DICTATION: .Note written in EPIC  PLAN OF CARE: Discharge to home after PACU  PATIENT DISPOSITION:  PACU - hemodynamically stable.   Delay start of Pharmacological VTE agent (>24hrs) due to surgical blood loss or risk of bleeding: not applicable

## 2016-12-30 NOTE — H&P (Addendum)
Debbie French is an 36 y.o. female. Presenting with a suspected ruptured ectopic pregnancy. Reports sudden onset LLQ pain radiating to back that was sharp/crampy. It has persisted. Worse 8/10  Although pt reports high pain tolerance. Did not take anything for it.  No syncope. + diarrhea/abdominal cramping. No dizziness. LMP is sure at 11/29/16. Was seen in office today by primary doc, Dr. Rana SnareLowe who requested an HCG and progesterone s/s spotting. She has continued to have light vaginal bleeding. Per pt, HCG was 64 and she was placed on progesterone suppositories. This is an unplanned but desired pregnancy.   Pertinent Gynecological History: Menses: N/A Bleeding: reg periods when not pregnant Sexually transmitted diseases: no past history OB History: G3P2002   Menstrual History: Patient's last menstrual period was 11/29/2016.    Past Medical History:  Diagnosis Date  . Anemia   . Headache     History reviewed. No pertinent surgical history.  History reviewed. No pertinent family history.  Social History:  reports that  has never smoked. she has never used smokeless tobacco. She reports that she does not drink alcohol or use drugs.  Allergies:  Allergies  Allergen Reactions  . Amoxicillin Hives  . Penicillins Hives    Medications Prior to Admission  Medication Sig Dispense Refill Last Dose  . ibuprofen (ADVIL,MOTRIN) 200 MG tablet Take 200 mg by mouth every 6 (six) hours as needed for headache.   Past Month at Unknown time  . ibuprofen (ADVIL,MOTRIN) 600 MG tablet Take 1 tablet (600 mg total) by mouth every 6 (six) hours. 30 tablet 1 Past Month at Unknown time  . Prenatal Vit-Fe Fumarate-FA (PRENATAL MULTIVITAMIN) TABS Take 1 tablet by mouth at bedtime.    Past Week at Unknown time  . progesterone (PROMETRIUM) 200 MG capsule Take 200 mg by mouth daily.   12/29/2016 at Unknown time    ROS  Blood pressure 133/85, pulse (!) 128, temperature 98.4 F (36.9 C), temperature source  Oral, height 5\' 2"  (1.575 m), weight 57.5 kg (126 lb 12 oz), last menstrual period 11/29/2016, unknown if currently breastfeeding. Physical Exam  Gen: NAD, conversive Pulm: NWOB Abd: soft, mildly distended, minimally tender through-out. No rebound/guarding Vagina with mild amount of blood, cervix closed. No CMT.   Results for orders placed or performed during the hospital encounter of 12/29/16 (from the past 24 hour(s))  hCG, quantitative, pregnancy     Status: Abnormal   Collection Time: 12/29/16 11:36 PM  Result Value Ref Range   hCG, Beta Chain, Quant, S 56 (H) <5 mIU/mL  Pregnancy, urine POC     Status: Abnormal   Collection Time: 12/30/16  1:35 AM  Result Value Ref Range   Preg Test, Ur POSITIVE (A) NEGATIVE    [place] Ob Comp Less 14 Wks  Result Date: 12/30/2016 CLINICAL DATA:  Severe pain with spotting EXAM: OBSTETRIC <14 WK US AND TRANSVAGINAL OB US TECHNIQUE: Both transabdominal and transvaginal ultrasound examinations were performed for complete evaluation of the gestation as well as the maternal uterus, adnexal regions, and pelvic cul-de-sac. Transvaginal technique was performed to assess early pregnancy. COMPARISON:  None. FINDINGS: Intrauterine gestational sac: Not visible Yolk sac:  Not visible Embryo:  Not visible Cardiac Activity: Not visible Maternal uterus/adnexae: Moderate free fluid in the pelvis. Right ovary measures 3 x 2.4 x 2.2 cm. Left ovary measures 1.4 x 2.8 x 1.2 cm. Heterogeneous collection within the left at adnexa, slightly tubular in configuration. Small cystic area with surrounding increased echogenicity, image number  42 and 43. IMPRESSION: 1. No intrauterine pregnancy visible 2. Complex collection in the left adnexa, suspicious for adnexal hematoma. Tiny cystic area with adjacent increased echogenicity in the left adnexa, separate from the ovary. In the absence of intrauterine pregnancy, collective findings are suspicious for ectopic pregnancy, possibly ruptured  given the moderate free fluid. Critical Value/emergent results were called by telephone at the time of interpretation on 12/30/2016 at 12:58 am to [facility] Dr. Belva AgeeELISE Vela Render , who verbally acknowledged these results. Electronically Signed   By: Jasmine PangKim  Fujinaga M.D.   On: 12/30/2016 00:59   [place] Ob Transvaginal  Result Date: 12/30/2016 CLINICAL DATA:  Severe pain with spotting EXAM: OBSTETRIC <14 WK US AND TRANSVAGINAL OB US TECHNIQUE: Both transabdominal and transvaginal ultrasound examinations were performed for complete evaluation of the gestation as well as the maternal uterus, adnexal regions, and pelvic cul-de-sac. Transvaginal technique was performed to assess early pregnancy. COMPARISON:  None. FINDINGS: Intrauterine gestational sac: Not visible Yolk sac:  Not visible Embryo:  Not visible Cardiac Activity: Not visible Maternal uterus/adnexae: Moderate free fluid in the pelvis. Right ovary measures 3 x 2.4 x 2.2 cm. Left ovary measures 1.4 x 2.8 x 1.2 cm. Heterogeneous collection within the left at adnexa, slightly tubular in configuration. Small cystic area with surrounding increased echogenicity, image number 42 and 43. IMPRESSION: 1. No intrauterine pregnancy visible 2. Complex collection in the left adnexa, suspicious for adnexal hematoma. Tiny cystic area with adjacent increased echogenicity in the left adnexa, separate from the ovary. In the absence of intrauterine pregnancy, collective findings are suspicious for ectopic pregnancy, possibly ruptured given the moderate free fluid. Critical Value/emergent results were called by telephone at the time of interpretation on 12/30/2016 at 12:58 [facility]. Belva AgeeELISE Cloyd Ragas , who verbally acknowledged these results. Electronically Signed   By: Jasmine PangKim  Fujinaga M.D.   On: 12/30/2016 00:59    Assessment/Plan: This is a 36 yo G2P2 (2 NSVDs) presenting with a likely ruptured ectopic pregnancy @ 4.3 wga. Patient w/LLQ pain and US with moderate ff, complex  fluid collection in L adnexa and a cystic area adjacent to L ovary. All of the findings are extremely c/f a ruptured ectopic. Her exam is overall benign despite these concerning US findings. Long d/w pt and husband regarding these findings. Discussed possibility that this is an early IUP with a ruptured cyst, however, this is much less likely than ectopic pregnancy - esp given L ovary normal appearing. I recommended DSC LSC with likely L salpingectomy. Patient and husband are in agreement with this plan. Risks discussed including infxn, bleeding, damage to surrounding structures, the possibility of a normal IUP, and the need for additional procedures. Patient and husband would like the fallopian tube removed if abnormal appearing - even if right fallopian tube abnormal. At this time, they decline D&C just in case this is a normal IUP. Discussed normal postoperative course including d/c postop, fu with Dr. Rana SnareLowe in the office, repeat HCG, and lifting/pelvic restrictions. All questions answered and informed consent given.  A+ per prior documentation, but T&S re-ordered. Will T&C x 2 units.    Debbie French 12/30/2016, 1:40 AM

## 2016-12-31 ENCOUNTER — Encounter (HOSPITAL_COMMUNITY): Payer: Self-pay | Admitting: Obstetrics and Gynecology

## 2016-12-31 LAB — GC/CHLAMYDIA PROBE AMP (~~LOC~~) NOT AT ARMC
CHLAMYDIA, DNA PROBE: NEGATIVE
NEISSERIA GONORRHEA: NEGATIVE

## 2017-01-02 LAB — BPAM RBC
BLOOD PRODUCT EXPIRATION DATE: 201812192359
Blood Product Expiration Date: 201812192359
UNIT TYPE AND RH: 6200
Unit Type and Rh: 6200

## 2017-01-02 LAB — TYPE AND SCREEN
ABO/RH(D): A POS
Antibody Screen: NEGATIVE
Unit division: 0
Unit division: 0

## 2017-03-19 ENCOUNTER — Other Ambulatory Visit (HOSPITAL_COMMUNITY): Payer: Self-pay | Admitting: Obstetrics and Gynecology

## 2017-03-19 DIAGNOSIS — Z8759 Personal history of other complications of pregnancy, childbirth and the puerperium: Secondary | ICD-10-CM

## 2017-03-25 ENCOUNTER — Ambulatory Visit (HOSPITAL_COMMUNITY): Payer: BLUE CROSS/BLUE SHIELD

## 2017-03-30 ENCOUNTER — Ambulatory Visit (HOSPITAL_COMMUNITY): Payer: BLUE CROSS/BLUE SHIELD

## 2017-04-12 ENCOUNTER — Encounter (HOSPITAL_COMMUNITY): Payer: Self-pay

## 2017-04-12 ENCOUNTER — Ambulatory Visit (HOSPITAL_COMMUNITY)
Admission: RE | Admit: 2017-04-12 | Discharge: 2017-04-12 | Disposition: A | Discharge status: Home / Self Care | Payer: BLUE CROSS/BLUE SHIELD | Source: Ambulatory Visit | Attending: Obstetrics and Gynecology | Admitting: Obstetrics and Gynecology

## 2017-05-17 ENCOUNTER — Other Ambulatory Visit (HOSPITAL_COMMUNITY): Payer: Self-pay | Admitting: Obstetrics and Gynecology

## 2017-05-17 DIAGNOSIS — Z8759 Personal history of other complications of pregnancy, childbirth and the puerperium: Secondary | ICD-10-CM

## 2017-06-01 ENCOUNTER — Ambulatory Visit (HOSPITAL_COMMUNITY)
Admission: RE | Admit: 2017-06-01 | Discharge: 2017-06-01 | Disposition: A | Discharge status: Home / Self Care | Payer: BLUE CROSS/BLUE SHIELD | Source: Ambulatory Visit | Attending: Obstetrics and Gynecology | Admitting: Obstetrics and Gynecology

## 2017-06-01 ENCOUNTER — Encounter (HOSPITAL_COMMUNITY): Payer: Self-pay | Admitting: Radiology

## 2017-06-01 DIAGNOSIS — Z8759 Personal history of other complications of pregnancy, childbirth and the puerperium: Secondary | ICD-10-CM | POA: Diagnosis present

## 2017-06-01 MED ORDER — IOPAMIDOL (ISOVUE-300) INJECTION 61%
30.0000 mL | Freq: Once | INTRAVENOUS | Status: AC | PRN
  Administered 2017-06-01: 5 mL

## 2018-01-11 LAB — OB RESULTS CONSOLE HIV ANTIBODY (ROUTINE TESTING): HIV: NONREACTIVE

## 2018-01-11 LAB — OB RESULTS CONSOLE HEPATITIS B SURFACE ANTIGEN: Hepatitis B Surface Ag: NEGATIVE

## 2018-01-11 LAB — OB RESULTS CONSOLE ABO/RH: RH Type: POSITIVE

## 2018-01-11 LAB — OB RESULTS CONSOLE GC/CHLAMYDIA
Chlamydia: NEGATIVE
Gonorrhea: NEGATIVE

## 2018-01-11 LAB — OB RESULTS CONSOLE RPR: RPR: NONREACTIVE

## 2018-01-11 LAB — OB RESULTS CONSOLE RUBELLA ANTIBODY, IGM: Rubella: IMMUNE

## 2018-01-11 LAB — OB RESULTS CONSOLE ANTIBODY SCREEN: Antibody Screen: NEGATIVE

## 2018-02-02 NOTE — L&D Delivery Note (Signed)
Delivery Note  SVD viable female Apgars 9,9 over 1st degree lac.  Placenta delivered spontaneously intact with 3VC. Repair with 3-0 Chromic with good support and hemostasis noted.  R/V exam confirms.  PH art was sent.   Mother and baby to couplet care and are doing well.  EBL 150cc  Louretta Shorten, MD

## 2018-07-29 ENCOUNTER — Encounter (HOSPITAL_COMMUNITY): Payer: Self-pay

## 2018-07-29 ENCOUNTER — Telehealth (HOSPITAL_COMMUNITY): Payer: Self-pay

## 2018-07-29 NOTE — Telephone Encounter (Signed)
Preadmission screen  

## 2018-08-01 ENCOUNTER — Other Ambulatory Visit (HOSPITAL_COMMUNITY)
Admission: RE | Admit: 2018-08-01 | Discharge: 2018-08-01 | Disposition: A | Discharge status: Home / Self Care | Payer: BC Managed Care – PPO | Source: Ambulatory Visit | Attending: Obstetrics and Gynecology | Admitting: Obstetrics and Gynecology

## 2018-08-01 ENCOUNTER — Other Ambulatory Visit: Payer: Self-pay

## 2018-08-01 DIAGNOSIS — Z01812 Encounter for preprocedural laboratory examination: Secondary | ICD-10-CM | POA: Insufficient documentation

## 2018-08-01 DIAGNOSIS — Z1159 Encounter for screening for other viral diseases: Secondary | ICD-10-CM | POA: Diagnosis not present

## 2018-08-01 NOTE — MAU Note (Signed)
Covid swab collected. PT tolerated well.Pt asymptomatic 

## 2018-08-02 ENCOUNTER — Other Ambulatory Visit (HOSPITAL_COMMUNITY): Payer: Self-pay | Admitting: *Deleted

## 2018-08-02 LAB — SARS CORONAVIRUS 2 (TAT 6-24 HRS): SARS Coronavirus 2: NEGATIVE

## 2018-08-02 NOTE — H&P (Signed)
Debbie French is a 38 y.o. female presenting for IOL due to Gestational HTN.  Pregnancy complicated by ama with normal NIPS.  Hx of Gest HTN on baby asa.  BPs last several weeks c/w gestational htn 140s/.80-90s with baseline bps 120s/70s.   [place] last week vertec 6+7# (63%).  GBS-. OB History    Gravida  4   Para  2   Term  2   Preterm  0   AB  1   Living  2     SAB  0   TAB  0   Ectopic  1   Multiple  0   Live Births  2          Past Medical History:  Diagnosis Date  . Anemia   . Cystic fibrosis carrier   . Headache   . Lyme disease    Past Surgical History:  Procedure Laterality Date  . LAPAROSCOPY N/A 12/30/2016   Procedure: LAPAROSCOPY OPERATIVE;  Surgeon: Tyson Dense, MD;  Location: [facility];  Service: Gynecology;  Laterality: N/A;  . UNILATERAL SALPINGECTOMY Left 12/30/2016   Procedure: UNILATERAL SALPINGECTOMY;  Surgeon: Tyson Dense, MD;  Location: [facility];  Service: Gynecology;  Laterality: Left;   Family History: family history includes Diabetes in her paternal grandmother; Dwarfism in an other family member; Heart disease in her maternal grandfather; Rheum arthritis in her maternal grandfather. Social History:  reports that she has never smoked. She has never used smokeless tobacco. She reports that she does not drink alcohol or use drugs.     Maternal Diabetes: No Genetic Screening: Normal Maternal Ultrasounds/Referrals: Normal Fetal Ultrasounds or other Referrals:  None Maternal Substance Abuse:  No Significant Maternal Medications:  None Significant Maternal Lab Results:  None Other Comments:  None  ROS History   Last menstrual period 11/14/2017, unknown if currently breastfeeding. Exam Physical Exam   Cx 1.4/40/-3 medium and anterior DTRs 1/4 Prenatal labs: ABO, Rh: A/Positive/-- (12/10 0000) Antibody: Negative (12/10 0000) Rubella: Immune (12/10 0000) RPR: Nonreactive (12/10 0000)  HBsAg: Negative (12/10 0000)   HIV: Non-reactive (12/10 0000)  GBS:     Assessment/Plan: IUP at term Gestational HTN PIan IOL with pitocin and AROM Anticipate SVD DL   Luz Lex 08/02/2018, 11:32 PM

## 2018-08-03 ENCOUNTER — Encounter (HOSPITAL_COMMUNITY): Payer: Self-pay | Admitting: *Deleted

## 2018-08-03 ENCOUNTER — Inpatient Hospital Stay (HOSPITAL_COMMUNITY): Payer: BC Managed Care – PPO | Admitting: Anesthesiology

## 2018-08-03 ENCOUNTER — Other Ambulatory Visit: Payer: Self-pay

## 2018-08-03 ENCOUNTER — Inpatient Hospital Stay (HOSPITAL_COMMUNITY): Payer: BC Managed Care – PPO

## 2018-08-03 ENCOUNTER — Inpatient Hospital Stay (HOSPITAL_COMMUNITY)
Admission: AD | Admit: 2018-08-03 | Discharge: 2018-08-05 | DRG: 807 | Disposition: A | Discharge status: Home / Self Care | Payer: BC Managed Care – PPO | Attending: Obstetrics and Gynecology | Admitting: Obstetrics and Gynecology

## 2018-08-03 DIAGNOSIS — Z3A37 37 weeks gestation of pregnancy: Secondary | ICD-10-CM

## 2018-08-03 DIAGNOSIS — O134 Gestational [pregnancy-induced] hypertension without significant proteinuria, complicating childbirth: Principal | ICD-10-CM | POA: Diagnosis present

## 2018-08-03 DIAGNOSIS — O9902 Anemia complicating childbirth: Secondary | ICD-10-CM | POA: Diagnosis present

## 2018-08-03 DIAGNOSIS — D649 Anemia, unspecified: Secondary | ICD-10-CM | POA: Diagnosis present

## 2018-08-03 DIAGNOSIS — Z141 Cystic fibrosis carrier: Secondary | ICD-10-CM

## 2018-08-03 DIAGNOSIS — Z3490 Encounter for supervision of normal pregnancy, unspecified, unspecified trimester: Secondary | ICD-10-CM | POA: Diagnosis present

## 2018-08-03 LAB — COMPREHENSIVE METABOLIC PANEL
ALT: 12 U/L (ref 0–44)
AST: 23 U/L (ref 15–41)
Albumin: 3 g/dL — ABNORMAL LOW (ref 3.5–5.0)
Alkaline Phosphatase: 86 U/L (ref 38–126)
Anion gap: 13 (ref 5–15)
BUN: 5 mg/dL — ABNORMAL LOW (ref 6–20)
CO2: 19 mmol/L — ABNORMAL LOW (ref 22–32)
Calcium: 8.9 mg/dL (ref 8.9–10.3)
Chloride: 105 mmol/L (ref 98–111)
Creatinine, Ser: 0.72 mg/dL (ref 0.44–1.00)
GFR calc Af Amer: >60 mL/min (ref >60)
GFR calc non Af Amer: >60 mL/min (ref >60)
Glucose, Bld: 126 mg/dL — ABNORMAL HIGH (ref 70–99)
Potassium: 3.3 mmol/L — ABNORMAL LOW (ref 3.5–5.1)
Sodium: 137 mmol/L (ref 135–145)
Total Bilirubin: 0.4 mg/dL (ref 0.3–1.2)
Total Protein: 6.2 g/dL — ABNORMAL LOW (ref 6.5–8.1)

## 2018-08-03 LAB — RPR: RPR Ser Ql: NONREACTIVE

## 2018-08-03 LAB — CBC
HCT: 37.1 % (ref 36.0–46.0)
Hemoglobin: 12.2 g/dL (ref 12.0–15.0)
MCH: 30.5 pg (ref 26.0–34.0)
MCHC: 32.9 g/dL (ref 30.0–36.0)
MCV: 92.8 fL (ref 80.0–100.0)
Platelets: 146 10*3/uL — ABNORMAL LOW (ref 150–400)
RBC: 4 MIL/uL (ref 3.87–5.11)
RDW: 13.6 % (ref 11.5–15.5)
WBC: 11.2 10*3/uL — ABNORMAL HIGH (ref 4.0–10.5)
nRBC: 0 % (ref 0.0–0.2)

## 2018-08-03 LAB — TYPE AND SCREEN
ABO/RH(D): A POS
Antibody Screen: NEGATIVE

## 2018-08-03 LAB — OB RESULTS CONSOLE GBS: GBS: NEGATIVE

## 2018-08-03 MED ORDER — OXYCODONE-ACETAMINOPHEN 5-325 MG PO TABS: 1.0000 | ORAL_TABLET | ORAL | Status: DC | PRN

## 2018-08-03 MED ORDER — DIPHENHYDRAMINE HCL 50 MG/ML IJ SOLN: 12.5000 mg | INTRAMUSCULAR | Status: DC | PRN

## 2018-08-03 MED ORDER — ACETAMINOPHEN 325 MG PO TABS: 650.0000 mg | ORAL_TABLET | ORAL | Status: DC | PRN

## 2018-08-03 MED ORDER — IBUPROFEN 600 MG PO TABS
600.0000 mg | ORAL_TABLET | Freq: Four times a day (QID) | ORAL | Status: DC
  Administered 2018-08-03 – 2018-08-05 (×6): 600 mg via ORAL
  Filled 2018-08-03 (×7): qty 1

## 2018-08-03 MED ORDER — BUTORPHANOL TARTRATE 1 MG/ML IJ SOLN: 1.0000 mg | INTRAMUSCULAR | Status: DC | PRN

## 2018-08-03 MED ORDER — BENZOCAINE-MENTHOL 20-0.5 % EX AERO: 1.0000 "application " | INHALATION_SPRAY | CUTANEOUS | Status: DC | PRN

## 2018-08-03 MED ORDER — LIDOCAINE HCL (PF) 1 % IJ SOLN
INTRAMUSCULAR | Status: DC | PRN
  Administered 2018-08-03 (×2): 4 mL via EPIDURAL

## 2018-08-03 MED ORDER — ZOLPIDEM TARTRATE 5 MG PO TABS: 5.0000 mg | ORAL_TABLET | Freq: Every evening | ORAL | Status: DC | PRN

## 2018-08-03 MED ORDER — OXYTOCIN 40 UNITS IN NORMAL SALINE INFUSION - SIMPLE MED: 2.5000 [IU]/h | INTRAVENOUS | Status: DC

## 2018-08-03 MED ORDER — ESCITALOPRAM OXALATE 10 MG PO TABS
10.0000 mg | ORAL_TABLET | Freq: Every day | ORAL | Status: DC
  Administered 2018-08-03 – 2018-08-04 (×2): 10 mg via ORAL
  Filled 2018-08-03 (×2): qty 1

## 2018-08-03 MED ORDER — PHENYLEPHRINE 40 MCG/ML (10ML) SYRINGE FOR IV PUSH (FOR BLOOD PRESSURE SUPPORT): 80.0000 ug | PREFILLED_SYRINGE | INTRAVENOUS | Status: DC | PRN

## 2018-08-03 MED ORDER — DIPHENHYDRAMINE HCL 25 MG PO CAPS: 25.0000 mg | ORAL_CAPSULE | Freq: Four times a day (QID) | ORAL | Status: DC | PRN

## 2018-08-03 MED ORDER — DIBUCAINE (PERIANAL) 1 % EX OINT: 1.0000 "application " | TOPICAL_OINTMENT | CUTANEOUS | Status: DC | PRN

## 2018-08-03 MED ORDER — LACTATED RINGERS IV SOLN
INTRAVENOUS | Status: DC
  Administered 2018-08-03 (×2): via INTRAVENOUS

## 2018-08-03 MED ORDER — LIDOCAINE HCL (PF) 1 % IJ SOLN: 30.0000 mL | INTRAMUSCULAR | Status: DC | PRN

## 2018-08-03 MED ORDER — ONDANSETRON HCL 4 MG/2ML IJ SOLN: 4.0000 mg | INTRAMUSCULAR | Status: DC | PRN

## 2018-08-03 MED ORDER — ONDANSETRON HCL 4 MG PO TABS: 4.0000 mg | ORAL_TABLET | ORAL | Status: DC | PRN

## 2018-08-03 MED ORDER — SODIUM CHLORIDE (PF) 0.9 % IJ SOLN
INTRAMUSCULAR | Status: DC | PRN
  Administered 2018-08-03: 12 mL/h via EPIDURAL

## 2018-08-03 MED ORDER — OXYCODONE-ACETAMINOPHEN 5-325 MG PO TABS: 2.0000 | ORAL_TABLET | ORAL | Status: DC | PRN

## 2018-08-03 MED ORDER — LACTATED RINGERS IV SOLN: 500.0000 mL | Freq: Once | INTRAVENOUS | Status: DC

## 2018-08-03 MED ORDER — WITCH HAZEL-GLYCERIN EX PADS: 1.0000 "application " | MEDICATED_PAD | CUTANEOUS | Status: DC | PRN

## 2018-08-03 MED ORDER — TETANUS-DIPHTH-ACELL PERTUSSIS 5-2.5-18.5 LF-MCG/0.5 IM SUSP: 0.5000 mL | Freq: Once | INTRAMUSCULAR | Status: DC

## 2018-08-03 MED ORDER — MEASLES, MUMPS & RUBELLA VAC IJ SOLR: 0.5000 mL | Freq: Once | INTRAMUSCULAR | Status: DC

## 2018-08-03 MED ORDER — SIMETHICONE 80 MG PO CHEW: 80.0000 mg | CHEWABLE_TABLET | ORAL | Status: DC | PRN

## 2018-08-03 MED ORDER — TERBUTALINE SULFATE 1 MG/ML IJ SOLN: 0.2500 mg | Freq: Once | INTRAMUSCULAR | Status: DC | PRN

## 2018-08-03 MED ORDER — EPHEDRINE 5 MG/ML INJ: 10.0000 mg | INTRAVENOUS | Status: DC | PRN

## 2018-08-03 MED ORDER — LACTATED RINGERS IV SOLN: 500.0000 mL | INTRAVENOUS | Status: DC | PRN

## 2018-08-03 MED ORDER — PRENATAL MULTIVITAMIN CH
1.0000 | ORAL_TABLET | Freq: Every day | ORAL | Status: DC
  Administered 2018-08-04 – 2018-08-05 (×2): 1 via ORAL
  Filled 2018-08-03 (×2): qty 1

## 2018-08-03 MED ORDER — COCONUT OIL OIL
1.0000 "application " | TOPICAL_OIL | Status: DC | PRN
  Administered 2018-08-03: 1 via TOPICAL

## 2018-08-03 MED ORDER — OXYTOCIN 40 UNITS IN NORMAL SALINE INFUSION - SIMPLE MED
1.0000 m[IU]/min | INTRAVENOUS | Status: DC
  Administered 2018-08-03: 09:00:00 2 m[IU]/min via INTRAVENOUS
  Filled 2018-08-03: qty 1000

## 2018-08-03 MED ORDER — ONDANSETRON HCL 4 MG/2ML IJ SOLN: 4.0000 mg | Freq: Four times a day (QID) | INTRAMUSCULAR | Status: DC | PRN

## 2018-08-03 MED ORDER — OXYTOCIN BOLUS FROM INFUSION
500.0000 mL | Freq: Once | INTRAVENOUS | Status: AC
  Administered 2018-08-03: 500 mL via INTRAVENOUS

## 2018-08-03 MED ORDER — SENNOSIDES-DOCUSATE SODIUM 8.6-50 MG PO TABS
2.0000 | ORAL_TABLET | ORAL | Status: DC
  Administered 2018-08-04 (×2): 2 via ORAL
  Filled 2018-08-03 (×2): qty 2

## 2018-08-03 MED ORDER — SOD CITRATE-CITRIC ACID 500-334 MG/5ML PO SOLN: 30.0000 mL | ORAL | Status: DC | PRN

## 2018-08-03 MED ORDER — FENTANYL-BUPIVACAINE-NACL 0.5-0.125-0.9 MG/250ML-% EP SOLN
12.0000 mL/h | EPIDURAL | Status: DC | PRN
  Filled 2018-08-03: qty 250

## 2018-08-03 NOTE — Lactation Note (Signed)
This note was copied from a baby's chart. Lactation Consultation Note  Patient Name: Girl Dena Esperanza JSEGB'T Date: 08/03/2018 Reason for consult: Initial assessment;Early term 37-38.6wks;Other (Comment)(Mastitis three times with previous pregnancies)  P3 mother whose infant is now 21 hours old.  Mother breast fed her other two children (now 36 years old and 64 years old) for only a few weeks each.  She developed mastitis twice with her first child and once with her second child at 5 days of life.   Mother requested lactation to discuss this past history.  She is hoping to exclusively breast feed this child.  Discussed mastitis in depth; mother is very knowledgeable.  Encouraged her to feed 8-12 times/24 hours or sooner if baby shows feeding cues.  Mother is familiar with feeding cues and hand expression.  Suggested she perform hand expression before/after feedings to help increase milk supply.  She will massage breasts prior to latching.  Reviewed techniques to obtain a deep latch and to alter baby's positioning at the breasts.  Suggested mother use her EBM to rub into nipple/areolas after every feeding to reduce the chances of nipple cracks.  She may also ask RN for coconut oil for extra lubrication.  Encouraged mother to pay particular attention to breasts since she has experienced this issue in the past.  Mother will seek attention immediately if she begins to feel any signs of mastitis.  Mother will call for latch assistance as needed.  Mom made aware of O/P services, breastfeeding support groups, community resources, and our phone # for post-discharge questions. She will return to work and has a DEBP for home use.  Father present and supportive.  RN updated.   Maternal Data Formula Feeding for Exclusion: No Has patient been taught Hand Expression?: Yes Does the patient have breastfeeding experience prior to this delivery?: Yes  Feeding Feeding Type: Breast Fed  LATCH Score Latch:  Repeated attempts needed to sustain latch, nipple held in mouth throughout feeding, stimulation needed to elicit sucking reflex.  Audible Swallowing: A few with stimulation  Type of Nipple: Everted at rest and after stimulation  Comfort (Breast/Nipple): Soft / non-tender  Hold (Positioning): No assistance needed to correctly position infant at breast.  LATCH Score: 8  Interventions    Lactation Tools Discussed/Used WIC Program: No   Consult Status Consult Status: Follow-up Date: 08/04/18 Follow-up type: In-patient    Brannon Decaire R Jamani Eley 08/03/2018, 8:22 PM

## 2018-08-03 NOTE — Anesthesia Procedure Notes (Signed)
Epidural Patient location during procedure: OB Start time: 08/03/2018 8:57 AM End time: 08/03/2018 9:06 AM  Staffing Anesthesiologist: Josephine Igo, MD Performed: anesthesiologist   Preanesthetic Checklist Completed: patient identified, site marked, surgical consent, pre-op evaluation, timeout performed, IV checked, risks and benefits discussed and monitors and equipment checked  Epidural Patient position: sitting Prep: site prepped and draped and DuraPrep Patient monitoring: continuous pulse ox and blood pressure Approach: midline Location: L3-L4 Injection technique: LOR air  Needle:  Needle type: Tuohy  Needle gauge: 17 G Needle length: 9 cm and 9 Needle insertion depth: 4 cm Catheter type: closed end flexible Catheter size: 19 Gauge Catheter at skin depth: 9 cm Test dose: negative and Other  Assessment Events: blood not aspirated, injection not painful, no injection resistance, negative IV test and no paresthesia  Additional Notes Patient identified. Risks and benefits discussed including failed block, incomplete  Pain control, post dural puncture headache, nerve damage, paralysis, blood pressure Changes, nausea, vomiting, reactions to medications-both toxic and allergic and post Partum back pain. All questions were answered. Patient expressed understanding and wished to proceed. Sterile technique was used throughout procedure. Epidural site was Dressed with sterile barrier dressing. No paresthesias, signs of intravascular injection Or signs of intrathecal spread were encountered.  Patient was more comfortable after the epidural was dosed. Please see RN's note for documentation of vital signs and FHR which are stable. Reason for block:procedure for pain

## 2018-08-03 NOTE — Progress Notes (Signed)
Patient ID: Debbie French, female   DOB: 11-26-1980, 38 y.o.   MRN: [id number] Pt comfortable with epidural  BP 146/94 FHR 140s cat 1 Ctxs q 3 "  Cx 3/75/-2 AROM clear  IOL  - AROM Pit pri Anticipate SVD DL

## 2018-08-03 NOTE — Anesthesia Preprocedure Evaluation (Signed)
Anesthesia Evaluation  Patient identified by MRN, date of birth, ID band Patient awake    Reviewed: Allergy & Precautions, Patient's Chart, lab work & pertinent test results  Airway Mallampati: I  TM Distance: >3 FB Neck ROM: Full    Dental no notable dental hx. (+) Teeth Intact   Pulmonary neg pulmonary ROS,    Pulmonary exam normal breath sounds clear to auscultation       Cardiovascular negative cardio ROS Normal cardiovascular exam Rhythm:Regular Rate:Normal     Neuro/Psych  Headaches, negative psych ROS   GI/Hepatic negative GI ROS, Neg liver ROS,   Endo/Other  negative endocrine ROS  Renal/GU negative Renal ROS  negative genitourinary   Musculoskeletal negative musculoskeletal ROS (+)   Abdominal   Peds  Hematology  (+) anemia , Thrombocytopenia - mild   Anesthesia Other Findings   Reproductive/Obstetrics (+) Pregnancy CF carrier                             Anesthesia Physical Anesthesia Plan  ASA: II  Anesthesia Plan: Epidural   Post-op Pain Management:    Induction:   PONV Risk Score and Plan:   Airway Management Planned: Natural Airway  Additional Equipment:   Intra-op Plan:   Post-operative Plan:   Informed Consent: I have reviewed the patients History and Physical, chart, labs and discussed the procedure including the risks, benefits and alternatives for the proposed anesthesia with the patient or authorized representative who has indicated his/her understanding and acceptance.       Plan Discussed with: Anesthesiologist  Anesthesia Plan Comments:         Anesthesia Quick Evaluation

## 2018-08-04 LAB — CBC
HCT: 32.9 % — ABNORMAL LOW (ref 36.0–46.0)
Hemoglobin: 10.9 g/dL — ABNORMAL LOW (ref 12.0–15.0)
MCH: 30.3 pg (ref 26.0–34.0)
MCHC: 33.1 g/dL (ref 30.0–36.0)
MCV: 91.4 fL (ref 80.0–100.0)
Platelets: 136 10*3/uL — ABNORMAL LOW (ref 150–400)
RBC: 3.6 MIL/uL — ABNORMAL LOW (ref 3.87–5.11)
RDW: 13.5 % (ref 11.5–15.5)
WBC: 12.3 10*3/uL — ABNORMAL HIGH (ref 4.0–10.5)
nRBC: 0 % (ref 0.0–0.2)

## 2018-08-04 LAB — ABO/RH: ABO/RH(D): A POS

## 2018-08-04 NOTE — Progress Notes (Signed)
Post Partum Day 1 Subjective: no complaints, up ad lib, voiding, tolerating PO and + flatus No HA, no vision change Objective: Blood pressure (!) 142/86, pulse 96, temperature 98.1 F (36.7 C), temperature source Oral, resp. rate 20, height 5\' 2"  (1.575 m), weight 72.6 kg, last menstrual period 11/14/2017, SpO2 100 %, unknown if currently breastfeeding.  Physical Exam:  General: alert, cooperative and no distress Lochia: appropriate Uterine Fundus: firm Incision: healing well DVT Evaluation: No evidence of DVT seen on physical exam.  Recent Labs    08/03/18 0800 08/04/18 0540  HGB 12.2 10.9*  HCT 37.1 32.9*    Assessment/Plan: Plan for discharge tomorrow Watch BP  LOS: 1 day   Shon Millet II 08/04/2018, 8:55 AM

## 2018-08-04 NOTE — Progress Notes (Signed)
MOB was referred for history of anxiety.  * Referral screened out by Clinical Social Worker because none of the following criteria appear to apply:  ~ History of anxiety/depression during this pregnancy, or of post-partum depression following prior delivery. ~ Diagnosis of anxiety and/or depression within last 3 years OR * MOB's symptoms currently being treated with medication and/or therapy. MOB currently prescribed, and taking, Lexapro. No concerns noted in [facility] records.  Please contact the Clinical Social Worker if needs arise, by [facility] request, or if MOB scores greater than 9/yes to question 10 on [place] Postpartum Depression Screen.  Elijio Miles, [place]  Women's and [employer] [phone]

## 2018-08-04 NOTE — Progress Notes (Signed)
Patient concerned about BP. No HA, no vision change, no epigastric pain.  Today's Vitals   08/04/18 1358 08/04/18 1806 08/04/18 2222 08/04/18 2224  BP: (!) 142/97 134/88 139/90   Pulse: 75 70 76   Resp: 16  18   Temp: 98.3 F (36.8 C)  98.2 F (36.8 C)   TempSrc: Oral  Oral   SpO2:   100%   Weight:      Height:      PainSc:    1    Body mass index is 29.26 kg/m.   Patient in NAD Lungs CTA Cor RRR DTR 2+  Labs ordered-not drawn yet  A/P: Gestational Hypertension         D/W patient lability of her BPs. None in severe range and most recent BP OK. She was running 140s/80-low90s in office and was 110-120 sys with home checks.          D/W PP preeclampsia although clinically she does not have this right now. Labs pending.          Will continue to monitor BPs tonight and FU on labs. Reviewed symptoms of preeclampsia. If BP remains elevated will start treatment. She will check BP at home 1-2x/day and probably office BP check in about 1 week.

## 2018-08-04 NOTE — Anesthesia Postprocedure Evaluation (Signed)
Anesthesia Post Note  Patient: Debbie French  Procedure(s) Performed: AN AD [place]     Patient location during evaluation: Mother Baby Anesthesia Type: Epidural Level of consciousness: awake and alert and oriented Pain management: satisfactory to patient Vital Signs Assessment: post-procedure vital signs reviewed and stable Respiratory status: spontaneous breathing and nonlabored ventilation Cardiovascular status: stable Postop Assessment: no headache, no backache, no signs of nausea or vomiting, adequate PO intake, patient able to bend at knees and able to ambulate (patient up walking) Anesthetic complications: no    Last Vitals:  Vitals:   08/04/18 0119 08/04/18 0500  BP: (!) 148/97 (!) 142/86  Pulse: 68 96  Resp: 20   Temp: 36.5 C 36.7 C  SpO2: 100% 100%    Last Pain:  Vitals:   08/04/18 0733  TempSrc:   PainSc: 1    Pain Goal:                Epidural/Spinal Function Cutaneous sensation: Normal sensation (08/04/18 0733), Patient able to flex knees: Yes (08/04/18 0733), Patient able to lift hips off bed: Yes (08/04/18 0733), Back pain beyond tenderness at insertion site: No (08/04/18 0733), Progressively worsening motor and/or sensory loss: No (08/04/18 0733), Bowel and/or bladder incontinence post epidural: No (08/04/18 0733)  Willa Rough

## 2018-08-05 LAB — COMPREHENSIVE METABOLIC PANEL
ALT: 11 U/L (ref 0–44)
AST: 20 U/L (ref 15–41)
Albumin: 2.5 g/dL — ABNORMAL LOW (ref 3.5–5.0)
Alkaline Phosphatase: 70 U/L (ref 38–126)
Anion gap: 8 (ref 5–15)
BUN: 6 mg/dL (ref 6–20)
CO2: 22 mmol/L (ref 22–32)
Calcium: 8.5 mg/dL — ABNORMAL LOW (ref 8.9–10.3)
Chloride: 109 mmol/L (ref 98–111)
Creatinine, Ser: 0.57 mg/dL (ref 0.44–1.00)
GFR calc Af Amer: >60 mL/min (ref >60)
GFR calc non Af Amer: >60 mL/min (ref >60)
Glucose, Bld: 128 mg/dL — ABNORMAL HIGH (ref 70–99)
Potassium: 3.6 mmol/L (ref 3.5–5.1)
Sodium: 139 mmol/L (ref 135–145)
Total Bilirubin: 0.3 mg/dL (ref 0.3–1.2)
Total Protein: 5.3 g/dL — ABNORMAL LOW (ref 6.5–8.1)

## 2018-08-05 LAB — CBC
HCT: 32.2 % — ABNORMAL LOW (ref 36.0–46.0)
Hemoglobin: 10.8 g/dL — ABNORMAL LOW (ref 12.0–15.0)
MCH: 30.9 pg (ref 26.0–34.0)
MCHC: 33.5 g/dL (ref 30.0–36.0)
MCV: 92 fL (ref 80.0–100.0)
Platelets: 160 10*3/uL (ref 150–400)
RBC: 3.5 MIL/uL — ABNORMAL LOW (ref 3.87–5.11)
RDW: 13.8 % (ref 11.5–15.5)
WBC: 12.8 10*3/uL — ABNORMAL HIGH (ref 4.0–10.5)
nRBC: 0 % (ref 0.0–0.2)

## 2018-08-05 MED ORDER — IBUPROFEN 600 MG PO TABS: 600.0000 mg | ORAL_TABLET | Freq: Four times a day (QID) | ORAL | 0 refills | Status: DC

## 2018-08-05 MED ORDER — LABETALOL HCL 100 MG PO TABS
100.0000 mg | ORAL_TABLET | Freq: Two times a day (BID) | ORAL | Status: DC
  Administered 2018-08-05: 100 mg via ORAL
  Filled 2018-08-05: qty 1

## 2018-08-05 MED ORDER — LABETALOL HCL 100 MG PO TABS: 100.0000 mg | ORAL_TABLET | Freq: Two times a day (BID) | ORAL | 0 refills | Status: DC

## 2018-08-05 NOTE — Discharge Summary (Signed)
Obstetric Discharge Summary Reason for Admission: induction of labor Prenatal Procedures: none Intrapartum Procedures: spontaneous vaginal delivery Postpartum Procedures: none Complications-Operative and Postpartum: 2nd degree perineal laceration Hemoglobin  Date Value Ref Range Status  08/05/2018 10.8 (L) 12.0 - 15.0 g/dL Final   HCT  Date Value Ref Range Status  08/05/2018 32.2 (L) 36.0 - 46.0 % Final    Physical Exam:  General: alert, cooperative and appears stated age 38: appropriate Uterine Fundus: firm Incision: healing well, no significant drainage, no dehiscence, no significant erythema DVT Evaluation: No evidence of DVT seen on physical exam.  Discharge Diagnoses: Term Pregnancy-delivered  Discharge Information: Date: 08/05/2018 Activity: pelvic rest Diet: routine Medications: Ibuprofen and labetalol Condition: improved Instructions: refer to practice specific booklet Discharge to: home   Newborn Data: Live born female  Birth Weight: 6 lb 6.3 oz (2900 g) APGAR: 9, 9  Newborn Delivery   Birth date/time: 08/03/2018 15:31:00 Delivery type: Vaginal, Spontaneous      Home with mother.  Debbie French 08/05/2018, 7:55 AM

## 2018-08-05 NOTE — Lactation Note (Signed)
This note was copied from a baby's chart. Lactation Consultation Note:  Staff nurse page for [facility] assistance with latching infant . Mother has bilateral cracking . The left nipple is most painful . Mother is a P3 with 2 attempts at breastfeeding other children.   Arrived in mothers room . Infant cuing and suckling strong on a pacifier.  Mother reports that infant began with just sucking for comfort. She reports that she has nipple pain with feedings. She reports using a NS with other two children due to pain.   Assist mother with latching infant on the rt breast in football hold. Infant latched with better depth. Infants upper lip difficult to flange , adjust infants lower jaw for wider gape. Mother reports pain better but still a #3 with suckling. Mother taught to do good breast compression. Observed good burst of suckling and swallows. Infant sustained latch for 15-20 mins. Observed pinch at the top of the nipple.   Mother fit with a #24 NS and infant latched for 5-10 mins with no observed milk transfer.   Observed that infant has a tight upper lip tie, high palate and a posterior tie.  Discussed seeing a Peds Specialist for a evaluation. Mother sees Dr Maisie Fus for [facility] care.   Infant placed on alternate breast for 15 mins.  Mother advised to get [facility] for nipples.  Mother was given comfort gels.   Mother reports having Mastitis 3 times with another child.  Advised mother to rest as much as possible.  Discussed keeping milk flowing and not getting engorged.   Mother to do good breast massage and ice breast.  Mother would like to see [facility] for follow up . She is aware of available [place] services.    Patient Name: Debbie French ELFYB'O Date: 08/05/2018 Reason for consult: Follow-up assessment   Maternal Data    Feeding Feeding Type: Breast Fed  LATCH Score Latch: Grasps breast easily, tongue down, lips flanged, rhythmical sucking.  Audible Swallowing: Spontaneous and  intermittent  Type of Nipple: Everted at rest and after stimulation  Comfort (Breast/Nipple): Engorged, cracked, bleeding, large blisters, severe discomfort  Hold (Positioning): Assistance needed to correctly position infant at breast and maintain latch.  LATCH Score: 7  Interventions Interventions: Breast feeding basics reviewed;Assisted with latch;Skin to skin;Hand express;Breast compression;Adjust position;Support pillows;Position options;Expressed milk;Comfort gels;Hand pump  Lactation Tools Discussed/Used Tools: Nipple Shields Nipple shield size: 24   Consult Status Consult Status: Complete    Darla Lesches 08/05/2018, 10:50 AM

## 2018-09-01 IMAGING — RF DG HYSTEROGRAM
4 series · 4 of 4 positions shown · non-contrast
Comparison: None.

CLINICAL DATA: History of ectopic pregnancy.

EXAM:
HYSTEROSALPINGOGRAM
TECHNIQUE: Hysterosalpingogram was performed by the ordering physician under
fluoroscopy. Fluoroscopic images were submitted for radiologic
interpretation following the procedure. Please see the procedural
report for the amount of contrast and the fluoroscopy time utilized.

[Series 1: run · 1 of 1 slices shown (1 of 4)]
[im 1/1]
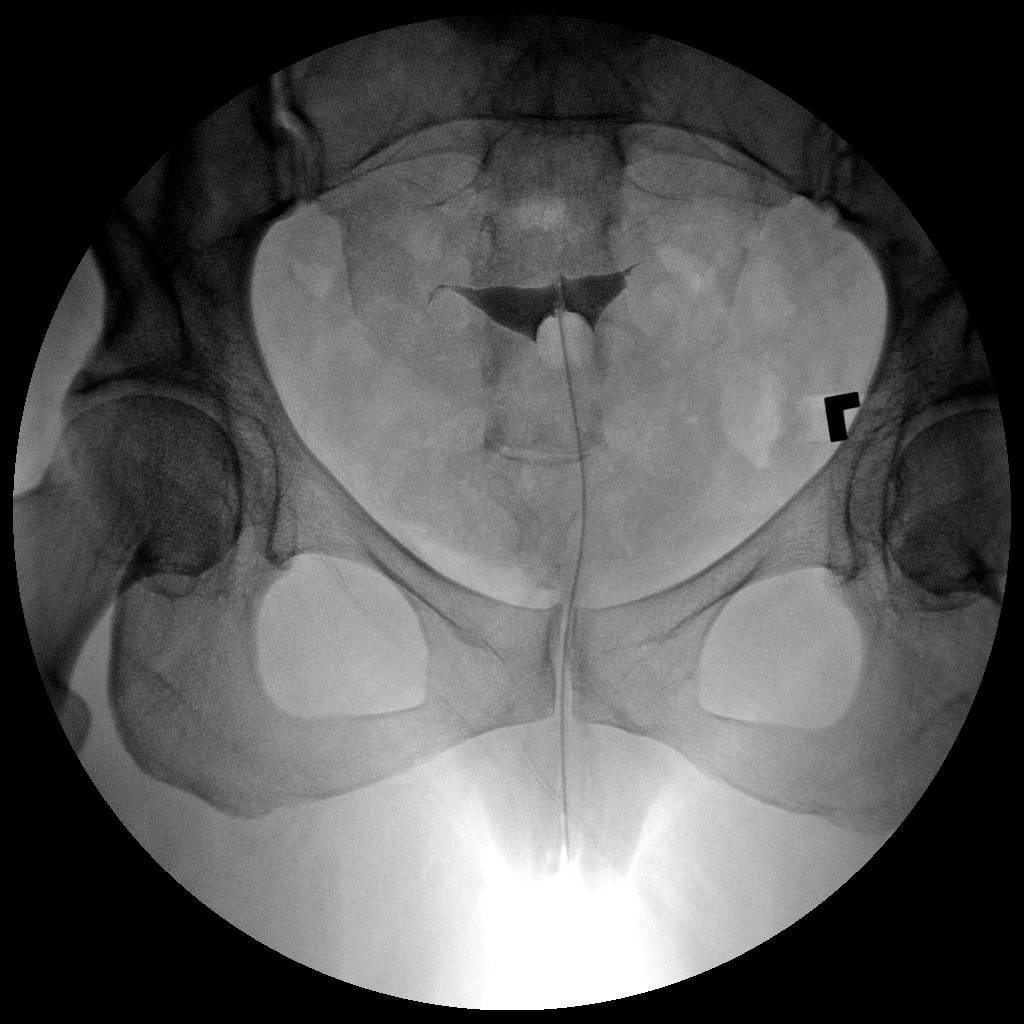

[Series 2: run · 1 of 1 slices shown (2 of 4)]
[im 1/1]
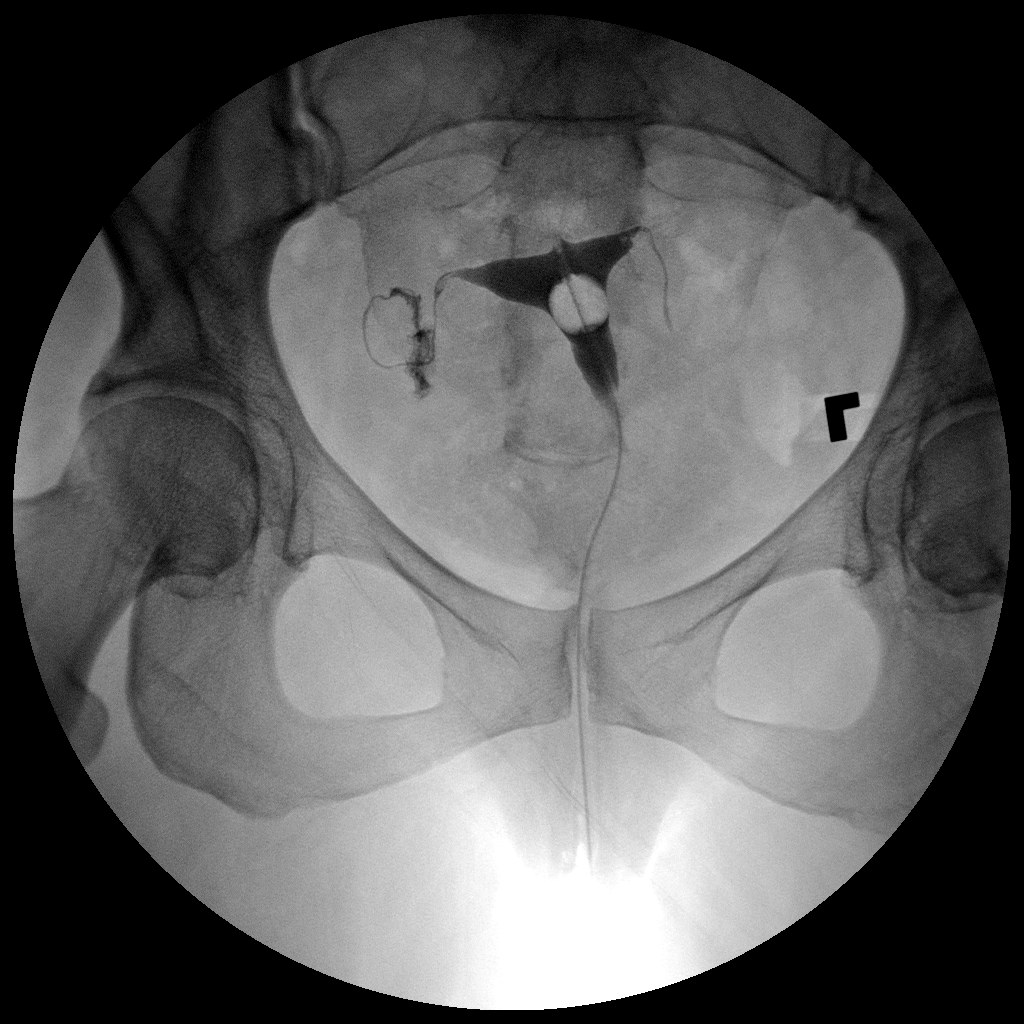

[Series 3: run · 1 of 1 slices shown (3 of 4)]
[im 1/1]
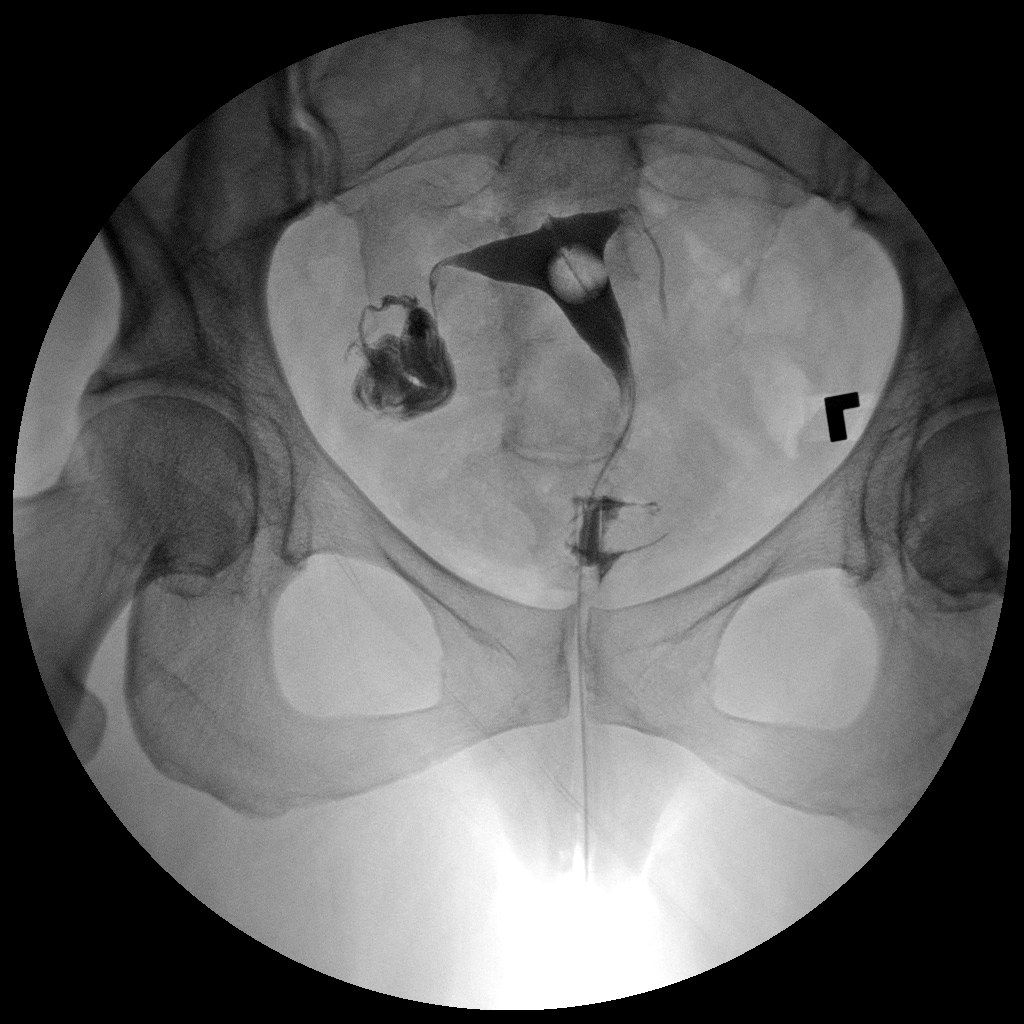

[Series 4: run · 1 of 1 slices shown (4 of 4)]
[im 1/1]
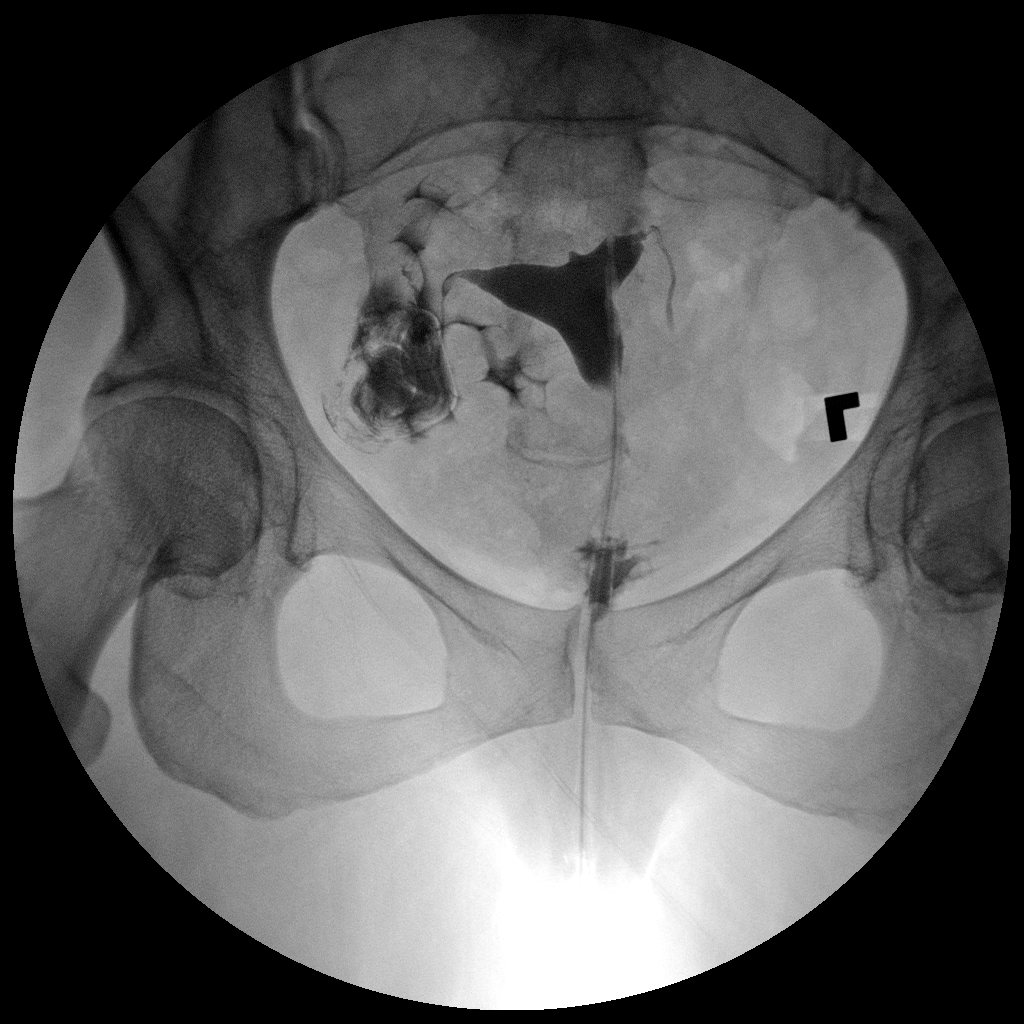

[4 of 4 positions shown; findings below may reference images not displayed]

FINDINGS: Normal appearance of the endometrial cavity. Right fallopian tube is
patent with spillage. The distal aspect of the right fallopian tube
is mildly prominent may reflect a small hydrosalpinx. Only the
proximal portion of the left fallopian tube is seen.
Nonvisualization of the left fallopian tube beyond the mid tubal
portion.
IMPRESSION: Patent right fallopian tube. Nonvisualization of the left fallopian
tube beyond the mid segment.

Mildly prominent distal right fallopian tube may reflect small
hydrosalpinx.

## 2019-03-30 ENCOUNTER — Ambulatory Visit: Payer: Self-pay | Attending: Internal Medicine

## 2019-03-30 ENCOUNTER — Ambulatory Visit: Payer: Self-pay

## 2019-03-30 ENCOUNTER — Other Ambulatory Visit: Payer: Self-pay

## 2019-03-30 DIAGNOSIS — Z23 Encounter for immunization: Secondary | ICD-10-CM | POA: Insufficient documentation

## 2019-03-30 NOTE — Progress Notes (Signed)
   Covid-19 Vaccination Clinic  Name:  Debbie French    MRN: [id number] DOB: [birth year]  03/30/2019  [name] was observed post Covid-19 immunization for 15 minutes without incidence. She was provided with Vaccine Information Sheet and instruction to access the V-Safe system.   [name] was instructed to call 911 with any severe reactions post vaccine: Marland Kitchen Difficulty breathing  . Swelling of your face and throat  . A fast heartbeat  . A bad rash all over your body  . Dizziness and weakness    Immunizations Administered    Name Date Dose VIS Date Route   Moderna COVID-19 Vaccine 03/30/2019  3:03 PM 0.5 mL 01/03/2019 Intramuscular   Manufacturer: Moderna   Lot: [id number]   NDC: 31517-616-07

## 2019-05-02 ENCOUNTER — Ambulatory Visit: Payer: Self-pay | Attending: Family

## 2019-05-02 DIAGNOSIS — Z23 Encounter for immunization: Secondary | ICD-10-CM

## 2019-05-02 NOTE — Progress Notes (Signed)
   Covid-19 Vaccination Clinic  Name:  Debbie French    MRN: [id number] DOB: 03/12/1980  05/02/2019  [name] was observed post Covid-19 immunization for 15 minutes without incident. She was provided with Vaccine Information Sheet and instruction to access the V-Safe system.   [name] was instructed to call 911 with any severe reactions post vaccine: Marland Kitchen Difficulty breathing  . Swelling of face and throat  . A fast heartbeat  . A bad rash all over body  . Dizziness and weakness   Immunizations Administered    Name Date Dose VIS Date Route   Moderna COVID-19 Vaccine 05/02/2019  9:21 AM 0.5 mL 01/03/2019 Intramuscular   Manufacturer: Moderna   Lot: [id number]   NDC: 90228-406-98

## 2019-09-05 ENCOUNTER — Telehealth: Payer: Self-pay | Admitting: Family

## 2019-09-05 DIAGNOSIS — J019 Acute sinusitis, unspecified: Secondary | ICD-10-CM

## 2019-09-05 DIAGNOSIS — B9689 Other specified bacterial agents as the cause of diseases classified elsewhere: Secondary | ICD-10-CM

## 2019-09-05 MED ORDER — DOXYCYCLINE HYCLATE 100 MG PO TABS: 100.0000 mg | ORAL_TABLET | Freq: Two times a day (BID) | ORAL | 0 refills | Status: DC

## 2019-09-05 NOTE — Progress Notes (Signed)
We are sorry that you are not feeling well.  Here is how we plan to help!  Based on what you have shared with me it looks like you have sinusitis.  Sinusitis is inflammation and infection in the sinus cavities of the head.  Based on your presentation I believe you most likely have Acute Bacterial Sinusitis.  This is an infection caused by bacteria and is treated with antibiotics. I have prescribed Doxycycline 100mg by mouth twice a day for 10 days. You may use an oral decongestant such as Mucinex D or if you have glaucoma or high blood pressure use plain Mucinex. Saline nasal spray help and can safely be used as often as needed for congestion.  If you develop worsening sinus pain, fever or notice severe headache and vision changes, or if symptoms are not better after completion of antibiotic, please schedule an appointment with a health care provider.    Sinus infections are not as easily transmitted as other respiratory infection, however we still recommend that you avoid close contact with loved ones, especially the very young and elderly.  Remember to wash your hands thoroughly throughout the day as this is the number one way to prevent the spread of infection!  Home Care:  Only take medications as instructed by your medical team.  Complete the entire course of an antibiotic.  Do not take these medications with alcohol.  A steam or ultrasonic humidifier can help congestion.  You can place a towel over your head and breathe in the steam from hot water coming from a faucet.  Avoid close contacts especially the very young and the elderly.  Cover your mouth when you cough or sneeze.  Always remember to wash your hands.  Get Help Right Away If:  You develop worsening fever or sinus pain.  You develop a severe head ache or visual changes.  Your symptoms persist after you have completed your treatment plan.  Make sure you  Understand these instructions.  Will watch your  condition.  Will get help right away if you are not doing well or get worse.  Your e-visit answers were reviewed by a board certified advanced clinical practitioner to complete your personal care plan.  Depending on the condition, your plan could have included both over the counter or prescription medications.  If there is a problem please reply  once you have received a response from your provider.  Your safety is important to us.  If you have drug allergies check your prescription carefully.    You can use MyChart to ask questions about today's visit, request a non-urgent call back, or ask for a work or school excuse for 24 hours related to this e-Visit. If it has been greater than 24 hours you will need to follow up with your provider, or enter a new e-Visit to address those concerns.  You will get an e-mail in the next two days asking about your experience.  I hope that your e-visit has been valuable and will speed your recovery. Thank you for using e-visits.  Greater than 5 minutes, yet less than 10 minutes of time have been spent researching, coordinating, and implementing care for this patient today.  Thank you for the details you included in the comment boxes. Those details are very helpful in determining the best course of treatment for you and help us to provide the best care.  

## 2020-07-10 ENCOUNTER — Telehealth: Payer: Self-pay | Admitting: Nurse Practitioner

## 2020-07-10 DIAGNOSIS — J019 Acute sinusitis, unspecified: Secondary | ICD-10-CM

## 2020-07-10 DIAGNOSIS — B9689 Other specified bacterial agents as the cause of diseases classified elsewhere: Secondary | ICD-10-CM

## 2020-07-10 MED ORDER — DOXYCYCLINE HYCLATE 100 MG PO TABS: 100.0000 mg | ORAL_TABLET | Freq: Two times a day (BID) | ORAL | 0 refills | Status: DC

## 2020-07-10 NOTE — Progress Notes (Signed)

## 2021-02-20 ENCOUNTER — Telehealth: Payer: 59 | Admitting: Family Medicine

## 2021-02-20 ENCOUNTER — Encounter: Payer: Self-pay | Admitting: Family Medicine

## 2021-02-20 DIAGNOSIS — U071 COVID-19: Secondary | ICD-10-CM

## 2021-02-20 MED ORDER — NIRMATRELVIR/RITONAVIR (PAXLOVID)TABLET: 3.0000 | ORAL_TABLET | Freq: Two times a day (BID) | ORAL | 0 refills | Status: AC

## 2021-02-20 NOTE — Patient Instructions (Signed)
Nirmatrelvir; Ritonavir Tablets °What is this medication? °NIRMATRELVIR; RITONAVIR (NIR ma TREL vir; ri TOE na veer) treats mild to moderate COVID-19. It may help people who are at high risk of developing severe illness. This medication works by limiting the spread of the virus in your body. The FDA has allowed the emergency use of this medication. °This medicine may be used for other purposes; ask your health care provider or pharmacist if you have questions. °COMMON BRAND NAME(S): PAXLOVID °What should I tell my care team before I take this medication? °They need to know if you have any of these conditions: °Any allergies °Any serious illness °Kidney disease °Liver disease °An unusual or allergic reaction to nirmatrelvir, ritonavir, other medications, foods, dyes, or preservatives °Pregnant or trying to get pregnant °Breast-feeding °How should I use this medication? °This product contains 2 different medications that are packaged together. For the standard dose, take 2 pink tablets of nirmatrelvir with 1 white tablet of ritonavir (3 tablets total) by mouth with water twice daily. Talk to your care team if you have kidney disease. You may need a different dose. Swallow the tablets whole. You can take it with or without food. If it upsets your stomach, take it with food. Take all of this medication unless your care team tells you to stop it early. Keep taking it even if you think you are better. °Talk to your care team about the use of this medication in children. While it may be prescribed for children as young as 12 years for selected conditions, precautions do apply. °Overdosage: If you think you have taken too much of this medicine contact a poison control center or emergency room at once. °NOTE: This medicine is only for you. Do not share this medicine with others. °What if I miss a dose? °If you miss a dose, take it as soon as you can unless it is more than 8 hours late. If it is more than 8 hours late, skip  the missed dose. Take the next dose at the normal time. Do not take extra or 2 doses at the same time to make up for the missed dose. °What may interact with this medication? °Do not take this medication with any of the following medications: °Alfuzosin °Certain medications for anxiety or sleep like midazolam, triazolam °Certain medications for cancer like apalutamide, enzalutamide °Certain medications for cholesterol like lovastatin, simvastatin °Certain medications for irregular heart beat like amiodarone, dronedarone, flecainide, propafenone, quinidine °Certain medications for pain like meperidine, piroxicam °Certain medications for psychotic disorders like clozapine, lurasidone, pimozide °Certain medications for seizures like carbamazepine, phenobarbital, phenytoin °Colchicine °Eletriptan °Eplerenone °Ergot alkaloids like dihydroergotamine, ergonovine, ergotamine, methylergonovine °Finerenone °Flibanserin °Ivabradine °Lomitapide °Naloxegol °Ranolazine °Rifampin °Sildenafil °Silodosin °St. John's Wort °Tolvaptan °Ubrogepant °Voclosporin °This medication may also interact with the following medications: °Bedaquiline °Birth control pills °Bosentan °Certain antibiotics like erythromycin or clarithromycin °Certain medications for blood pressure like amlodipine, diltiazem, felodipine, nicardipine, nifedipine °Certain medications for cancer like abemaciclib, ceritinib, dasatinib, encorafenib, ibrutinib, ivosidenib, neratinib, nilotinib, venetoclax, vinblastine, vincristine °Certain medications for cholesterol like atorvastatin, rosuvastatin °Certain medications for depression like bupropion, trazodone °Certain medications for fungal infections like isavuconazonium, itraconazole, ketoconazole, voriconazole °Certain medications for hepatitis C like elbasvir; grazoprevir, dasabuvir; ombitasvir; paritaprevir; ritonavir, glecaprevir; pibrentasvir, sofosbuvir; velpatasvir; voxilaprevir °Certain medications for HIV or  AIDS °Certain medications for irregular heartbeat like lidocaine °Certain medications that treat or prevent blood clots like rivaroxaban, warfarin °Digoxin °Fentanyl °Medications that lower your chance of fighting infection like cyclosporine, sirolimus, tacrolimus °Methadone °Quetiapine °  Rifabutin °Salmeterol °Steroid medications like betamethasone, budesonide, ciclesonide, dexamethasone, fluticasone, methylprednisone, mometasone, triamcinolone °This list may not describe all possible interactions. Give your health care provider a list of all the medicines, herbs, non-prescription drugs, or dietary supplements you use. Also tell them if you smoke, drink alcohol, or use illegal drugs. Some items may interact with your medicine. °What should I watch for while using this medication? °Your condition will be monitored carefully while you are receiving this medication. Visit your care team for regular checkups. Tell your care team if your symptoms do not start to get better or if they get worse. °If you have untreated HIV infection, this medication may lead to some HIV medications not working as well in the future. °Birth control may not work properly while you are taking this medication. Talk to your care team about using an extra method of birth control. °What side effects may I notice from receiving this medication? °Side effects that you should report to your care team as soon as possible: °Allergic reactions--skin rash, itching, hives, swelling of the face, lips, tongue, or throat °Liver injury--right upper belly pain, loss of appetite, nausea, light-colored stool, dark yellow or brown urine, yellowing skin or eyes, unusual weakness or fatigue °Redness, blistering, peeling, or loosening of the skin, including inside the mouth °Side effects that usually do not require medical attention (report these to your care team if they continue or are bothersome): °Change in taste °Diarrhea °General discomfort and  fatigue °Increase in blood pressure °Muscle pain °Nausea °Stomach pain °This list may not describe all possible side effects. Call your doctor for medical advice about side effects. You may report side effects to FDA at 1-800-FDA-1088. °Where should I keep my medication? °Keep out of the reach of children and pets. °Store at room temperature between 20 and 25 degrees C (68 and 77 degrees F). Get rid of any unused medication after the expiration date. °To get rid of medications that are no longer needed or have expired: °Take the medication to a medication take-back program. Check with your pharmacy or law enforcement to find a location. °If you cannot return the medication, check the label or package insert to see if the medication should be thrown out in the garbage or flushed down the toilet. If you are not sure, ask your care team. If it is safe to put it in the trash, take the medication out of the container. Mix the medication with cat litter, dirt, coffee grounds, or other unwanted substance. Seal the mixture in a bag or container. Put it in the trash. °NOTE: This sheet is a summary. It may not cover all possible information. If you have questions about this medicine, talk to your doctor, pharmacist, or health care provider. °© 2022 [employer]vier/Gold Standard (2020-10-21 00:00:00) ° ° °COVID-19: What to Do if You Are Sick °If you test positive and are an older adult or someone who is at high risk of getting very sick from COVID-19, treatment may be available. Contact a healthcare provider right away after a positive test to determine if you are eligible, even if your symptoms are mild right now. You can also visit a Test to Treat location and, if eligible, receive a prescription from a provider. Don't delay: Treatment must be started within the first few days to be effective. °If you have a fever, cough, or other symptoms, you might have COVID-19. Most people have mild illness and are able to recover at home. If  you are sick: °  Keep track of your symptoms. °If you have an emergency warning sign (including trouble breathing), call 911. °Steps to help prevent the spread of COVID-19 if you are sick °If you are sick with COVID-19 or think you might have COVID-19, follow the steps below to care for yourself and to help protect other people in your home and community. °Stay home except to get medical care °Stay home. Most people with COVID-19 have mild illness and can recover at home without medical care. Do not leave your home, except to get medical care. Do not visit public areas and do not go to places where you are unable to wear a mask. °Take care of yourself. Get rest and stay hydrated. Take over-the-counter medicines, such as acetaminophen, to help you feel better. °Stay in touch with your doctor. Call before you get medical care. Be sure to get care if you have trouble breathing, or have any other emergency warning signs, or if you think it is an emergency. °Avoid public transportation, ride-sharing, or taxis if possible. °Get tested °If you have symptoms of COVID-19, get tested. While waiting for test results, stay away from others, including staying apart from those living in your household. °Get tested as soon as possible after your symptoms start. Treatments may be available for people with COVID-19 who are at risk for becoming very sick. Don't delay: Treatment must be started early to be effective--some treatments must begin within 5 days of your first symptoms. Contact your healthcare provider right away if your test result is positive to determine if you are eligible. °Self-tests are one of several options for testing for the virus that causes COVID-19 and may be more convenient than laboratory-based tests and point-of-care tests. Ask your healthcare provider or your local health department if you need help interpreting your test results. °You can visit your state, tribal, local, and territorial health department's  website to look for the latest local information on testing sites. °Separate yourself from other people °As much as possible, stay in a specific room and away from other people and pets in your home. If possible, you should use a separate bathroom. If you need to be around other people or animals in or outside of the home, wear a well-fitting mask. °Tell your close contacts that they may have been exposed to COVID-19. An infected person can spread COVID-19 starting 48 hours (or 2 days) before the person has any symptoms or tests positive. By letting your close contacts know they may have been exposed to COVID-19, you are helping to protect everyone. °See COVID-19 and Animals if you have questions about pets. °If you are diagnosed with COVID-19, someone from the health department may call you. Answer the call to slow the spread. °Monitor your symptoms °Symptoms of COVID-19 include fever, cough, or other symptoms. °Follow care instructions from your healthcare provider and local health department. Your local health authorities may give instructions on checking your symptoms and reporting information. °When to seek emergency medical attention °Look for emergency warning signs* for COVID-19. If someone is showing any of these signs, seek emergency medical care immediately: °Trouble breathing °Persistent pain or pressure in the chest °New confusion °Inability to wake or stay awake °Pale, gray, or blue-colored skin, lips, or nail beds, depending on skin tone °*This list is not all possible symptoms. Please call your medical provider for any other symptoms that are severe or concerning to you. °Call 911 or call ahead to your local emergency facility: Notify the operator that   you are seeking care for someone who has or may have COVID-19. °Call ahead before visiting your doctor °Call ahead. Many medical visits for routine care are being postponed or done by phone or telemedicine. °If you have a medical appointment that  cannot be postponed, call your doctor's office, and tell them you have or may have COVID-19. This will help the office protect themselves and other patients. °If you are sick, wear a well-fitting mask °You should wear a mask if you must be around other people or animals, including pets (even at home). °Wear a mask with the best fit, protection, and comfort for you. °You don't need to wear the mask if you are alone. If you can't put on a mask (because of trouble breathing, for example), cover your coughs and sneezes in some other way. Try to stay at least 6 feet away from other people. This will help protect the people around you. °Masks should not be placed on young children under age 2 years, anyone who has trouble breathing, or anyone who is not able to remove the mask without help. °Cover your coughs and sneezes °Cover your mouth and nose with a tissue when you cough or sneeze. °Throw away used tissues in a lined trash can. °Immediately wash your hands with soap and water for at least 20 seconds. If soap and water are not available, clean your hands with an alcohol-based hand sanitizer that contains at least 60% alcohol. °Clean your hands often °Wash your hands often with soap and water for at least 20 seconds. This is especially important after blowing your nose, coughing, or sneezing; going to the bathroom; and before eating or preparing food. °Use hand sanitizer if soap and water are not available. Use an alcohol-based hand sanitizer with at least 60% alcohol, covering all surfaces of your hands and rubbing them together until they feel dry. °Soap and water are the best option, especially if hands are visibly dirty. °Avoid touching your eyes, nose, and mouth with unwashed hands. °Handwashing Tips °Avoid sharing personal household items °Do not share dishes, drinking glasses, cups, eating utensils, towels, or bedding with other people in your home. °Wash these items thoroughly after using them with soap and  water or put in the dishwasher. °Clean surfaces in your home regularly °Clean and disinfect high-touch surfaces (for example, doorknobs, tables, handles, light switches, and countertops) in your "sick room" and bathroom. In shared spaces, you should clean and disinfect surfaces and items after each use by the person who is ill. °If you are sick and cannot clean, a caregiver or other person should only clean and disinfect the area around you (such as your bedroom and bathroom) on an as needed basis. Your caregiver/other person should wait as long as possible (at least several hours) and wear a mask before entering, cleaning, and disinfecting shared spaces that you use. °Clean and disinfect areas that may have blood, stool, or body fluids on them. °Use household cleaners and disinfectants. Clean visible dirty surfaces with household cleaners containing soap or detergent. Then, use a household disinfectant. °Use a product from EPA's List N: Disinfectants for Coronavirus (COVID-19). °Be sure to follow the instructions on the label to ensure safe and effective use of the product. Many products recommend keeping the surface wet with a disinfectant for a certain period of time (look at "contact time" on the product label). °You may also need to wear personal protective equipment, such as gloves, depending on the directions on the product label. °  Immediately after disinfecting, wash your hands with soap and water for 20 seconds. °For completed guidance on cleaning and disinfecting your home, visit Complete Disinfection Guidance. °Take steps to improve ventilation at home °Improve ventilation (air flow) at home to help prevent from spreading COVID-19 to other people in your household. °Clear out COVID-19 virus particles in the air by opening windows, using air filters, and turning on fans in your home. °Use this interactive tool to learn how to improve air flow in your home. °When you can be around others after being sick  with COVID-19 °Deciding when you can be around others is different for different situations. Find out when you can safely end home isolation. °For any additional questions about your care, contact your healthcare provider or state or local health department. °04/23/2020 °Content source: National Center for Immunization and Respiratory Diseases (NCIRD), Division of Viral Diseases °This information is not intended to replace advice given to you by your health care provider. Make sure you discuss any questions you have with your health care provider. °Document Revised: 10/11/2020 Document Reviewed: 10/11/2020 °Elsevier Patient Education © 2022 [employer] Inc. ° °

## 2021-02-20 NOTE — Progress Notes (Signed)
Virtual Visit Consent   Debbie French, you are scheduled for a virtual visit with a [facility] provider today.     Just as with appointments in the office, your consent must be obtained to participate.  Your consent will be active for this visit and any virtual visit you may have with one of our providers in the next 365 days.     If you have a MyChart account, a copy of this consent can be sent to you electronically.  All virtual visits are billed to your insurance company just like a traditional visit in the office.    As this is a virtual visit, video technology does not allow for your provider to perform a traditional examination.  This may limit your provider's ability to fully assess your condition.  If your provider identifies any concerns that need to be evaluated in person or the need to arrange testing (such as labs, EKG, etc.), we will make arrangements to do so.     Although advances in technology are sophisticated, we cannot ensure that it will always work on either your end or our end.  If the connection with a video visit is poor, the visit may have to be switched to a telephone visit.  With either a video or telephone visit, we are not always able to ensure that we have a secure connection.     I need to obtain your verbal consent now.   Are you willing to proceed with your visit today?    Debbie French has provided verbal consent on 02/20/2021 for a virtual visit (video or telephone).   Freddy Finner, NP   Date: 02/20/2021 12:44 PM   Virtual Visit via Video Note   I, Freddy Finner, connected with  Debbie French  ([id number], 08/29/1980) on 02/20/21 at 12:45 PM EST by a video-enabled telemedicine application and verified that I am speaking with the correct person using two identifiers.  Location: Patient: Virtual Visit Location Patient: Home Provider: Virtual Visit Location Provider: Home Office   I discussed the limitations of evaluation and management by  telemedicine and the availability of in person appointments. The patient expressed understanding and agreed to proceed.    History of Present Illness: Debbie French is a 41 y.o. who identifies as a female who was assigned female at birth, and is being seen today for covid +  Symptoms onset was Tues night was feeling very achy all over. Wednesday some congestion set in and this morning she feels it even more achy and increased congestion. [place]  Reports sore throat, mild pressure in ears, temp max 99.8, sinus headache pressure.   Denies chest pain or shortness of breath.  Vaccinated:  Covid-Moderna and Boosted fully  Flu-yes for 2022   Problems:  Patient Active Problem List   Diagnosis Date Noted   Encounter for induction of labor 08/03/2018   Pregnancy of unknown anatomic location 12/30/2016   Active labor 05/08/2014    Allergies:  Allergies  Allergen Reactions   Amoxicillin Hives   Penicillins Hives   Medications:  Current Outpatient Medications:    acetaminophen (TYLENOL) 325 MG tablet, Take 650 mg by mouth every 6 (six) hours as needed., Disp: , Rfl:    doxycycline (VIBRA-TABS) 100 MG tablet, Take 1 tablet (100 mg total) by mouth 2 (two) times daily. 1 po bid, Disp: 20 tablet, Rfl: 0   ibuprofen (ADVIL)  600 MG tablet, Take 1 tablet (600 mg total) by mouth every 6 (six) hours., Disp: 30 tablet, Rfl: 0   labetalol (NORMODYNE) 100 MG tablet, Take 1 tablet (100 mg total) by mouth 2 (two) times daily., Disp: 60 tablet, Rfl: 0   Prenatal Vit-Fe Fumarate-FA (PRENATAL MULTIVITAMIN) TABS, Take 1 tablet by mouth at bedtime. , Disp: , Rfl:   Observations/Objective: Patient is well-developed, well-nourished in no acute distress.  Resting comfortably  at home.  Head is normocephalic, atraumatic.  No labored breathing.  Speech is clear and coherent with logical content.  Patient is alert and oriented at baseline.     Assessment and Plan:  1. COVID-19 +HT test this morning S&S consistent with COVID, mild but progressive in last 24 hours Desire for antiviral  Counseling on pregnancy and risk for 3 months   Reviewed side effects, risks and benefits of medication.     Patient acknowledged agreement and understanding of the plan.    - nirmatrelvir/ritonavir EUA (PAXLOVID) 20 x 150 MG & 10 x 100MG  TABS; Take 3 tablets by mouth 2 (two) times daily for 5 days. (Take nirmatrelvir 150 mg two tablets twice daily for 5 days and ritonavir 100 mg one tablet twice daily for 5 days) Patient GFR is 94  Dispense: 30 tablet; Refill: 0     Follow Up Instructions: I discussed the assessment and treatment plan with the patient. The patient was provided an opportunity to ask questions and all were answered. The patient agreed with the plan and demonstrated an understanding of the instructions.  A copy of instructions were sent to the patient via MyChart unless otherwise noted below.    The patient was advised to call back or seek an in-person evaluation if the symptoms worsen or if the condition fails to improve as anticipated.  Time:  I spent 10 minutes with the patient via telehealth technology discussing the above problems/concerns.    , NP

## 2021-02-24 ENCOUNTER — Telehealth: Payer: 59 | Admitting: Emergency Medicine

## 2021-02-24 DIAGNOSIS — B9689 Other specified bacterial agents as the cause of diseases classified elsewhere: Secondary | ICD-10-CM | POA: Diagnosis not present

## 2021-02-24 DIAGNOSIS — J019 Acute sinusitis, unspecified: Secondary | ICD-10-CM

## 2021-02-24 MED ORDER — DOXYCYCLINE HYCLATE 100 MG PO TABS: 100.0000 mg | ORAL_TABLET | Freq: Two times a day (BID) | ORAL | 0 refills | Status: DC

## 2021-02-24 NOTE — Progress Notes (Signed)
E-Visit for Sinus Problems ° °We are sorry that you are not feeling well.  Here is how we plan to help! ° °Based on what you have shared with me it looks like you have sinusitis.  Sinusitis is inflammation and infection in the sinus cavities of the head.  Based on your presentation I believe you most likely have Acute Bacterial Sinusitis.  This is an infection caused by bacteria and is treated with antibiotics. I have prescribed Doxycycline 100mg by mouth twice a day for 10 days. You may use an oral decongestant such as Mucinex D or if you have glaucoma or high blood pressure use plain Mucinex. Saline nasal spray help and can safely be used as often as needed for congestion.  If you develop worsening sinus pain, fever or notice severe headache and vision changes, or if symptoms are not better after completion of antibiotic, please schedule an appointment with a health care provider.   ° °Sinus infections are not as easily transmitted as other respiratory infection, however we still recommend that you avoid close contact with loved ones, especially the very young and elderly.  Remember to wash your hands thoroughly throughout the day as this is the number one way to prevent the spread of infection! ° °Home Care: °Only take medications as instructed by your medical team. °Complete the entire course of an antibiotic. °Do not take these medications with alcohol. °A steam or ultrasonic humidifier can help congestion.  You can place a towel over your head and breathe in the steam from hot water coming from a faucet. °Avoid close contacts especially the very young and the elderly. °Cover your mouth when you cough or sneeze. °Always remember to wash your hands. ° °Get Help Right Away If: °You develop worsening fever or sinus pain. °You develop a severe head ache or visual changes. °Your symptoms persist after you have completed your treatment plan. ° °Make sure you °Understand these instructions. °Will watch your  condition. °Will get help right away if you are not doing well or get worse. ° °Thank you for choosing an e-visit. ° °Your e-visit answers were reviewed by a board certified advanced clinical practitioner to complete your personal care plan. Depending upon the condition, your plan could have included both over the counter or prescription medications. ° °Please review your pharmacy choice. Make sure the pharmacy is open so you can pick up prescription now. If there is a problem, you may contact your provider through MyChart messaging and have the prescription routed to another pharmacy.  Your safety is important to us. If you have drug allergies check your prescription carefully.  ° °For the next 24 hours you can use MyChart to ask questions about today's visit, request a non-urgent call back, or ask for a work or school excuse. °You will get an email in the next two days asking about your experience. I hope that your e-visit has been valuable and will speed your recovery. ° °I have spent 5 minutes in review of e-visit questionnaire, review and updating patient chart, medical decision making and response to patient.  ° °Artin Mceuen, PhD, FNP-BC °  ° °

## 2021-05-04 ENCOUNTER — Telehealth: Payer: 59 | Admitting: Family

## 2021-05-04 DIAGNOSIS — J029 Acute pharyngitis, unspecified: Secondary | ICD-10-CM | POA: Diagnosis not present

## 2021-05-04 MED ORDER — AZITHROMYCIN 250 MG PO TABS: ORAL_TABLET | ORAL | 0 refills | Status: DC

## 2021-05-04 NOTE — Progress Notes (Signed)
E-Visit for Sore Throat - Strep Symptoms  We are sorry that you are not feeling well.  Here is how we plan to help!  Based on what you have shared with me it is likely that you have strep pharyngitis.  Strep pharyngitis is inflammation and infection in the back of the throat.  This is an infection cause by bacteria and is treated with antibiotics.  I have prescribed Azithromycin 250 mg two tablets today and then one daily for 4 additional days. For throat pain, we recommend over the counter oral pain relief medications such as acetaminophen or aspirin, or anti-inflammatory medications such as ibuprofen or naproxen sodium. Topical treatments such as oral throat lozenges or sprays may be used as needed. Strep infections are not as easily transmitted as other respiratory infections, however we still recommend that you avoid close contact with loved ones, especially the very young and elderly.  Remember to wash your hands thoroughly throughout the day as this is the number one way to prevent the spread of infection and wipe down door knobs and counters with disinfectant.   Home Care: Only take medications as instructed by your medical team. Complete the entire course of an antibiotic. Do not take these medications with alcohol. A steam or ultrasonic humidifier can help congestion.  You can place a towel over your head and breathe in the steam from hot water coming from a faucet. Avoid close contacts especially the very young and the elderly. Cover your mouth when you cough or sneeze. Always remember to wash your hands.  Get Help Right Away If: You develop worsening fever or sinus pain. You develop a severe head ache or visual changes. Your symptoms persist after you have completed your treatment plan.  Make sure you Understand these instructions. Will watch your condition. Will get help right away if you are not doing well or get worse.   Thank you for choosing an e-visit.  Your e-visit  answers were reviewed by a board certified advanced clinical practitioner to complete your personal care plan. Depending upon the condition, your plan could have included both over the counter or prescription medications.  Please review your pharmacy choice. Make sure the pharmacy is open so you can pick up prescription now. If there is a problem, you may contact your provider through MyChart messaging and have the prescription routed to another pharmacy.  Your safety is important to us. If you have drug allergies check your prescription carefully.   For the next 24 hours you can use MyChart to ask questions about today's visit, request a non-urgent call back, or ask for a work or school excuse. You will get an email in the next two days asking about your experience. I hope that your e-visit has been valuable and will speed your recovery.  Approximately 5 minutes was spent documenting and reviewing patient's chart.     

## 2021-06-09 ENCOUNTER — Telehealth: Payer: 59 | Admitting: Physician Assistant

## 2021-06-09 DIAGNOSIS — J019 Acute sinusitis, unspecified: Secondary | ICD-10-CM | POA: Diagnosis not present

## 2021-06-09 DIAGNOSIS — B9689 Other specified bacterial agents as the cause of diseases classified elsewhere: Secondary | ICD-10-CM | POA: Diagnosis not present

## 2021-06-09 MED ORDER — DOXYCYCLINE HYCLATE 100 MG PO TABS: 100.0000 mg | ORAL_TABLET | Freq: Two times a day (BID) | ORAL | 0 refills | Status: DC

## 2021-06-09 NOTE — Progress Notes (Signed)

## 2021-08-24 ENCOUNTER — Telehealth: Payer: 59 | Admitting: Nurse Practitioner

## 2021-08-24 DIAGNOSIS — J020 Streptococcal pharyngitis: Secondary | ICD-10-CM | POA: Diagnosis not present

## 2021-08-24 MED ORDER — AZITHROMYCIN 250 MG PO TABS: ORAL_TABLET | ORAL | 0 refills | Status: AC

## 2021-08-24 NOTE — Progress Notes (Signed)
E-Visit for Sore Throat - Strep Symptoms  We are sorry that you are not feeling well.  Here is how we plan to help!  Based on what you have shared with me it is likely that you have strep pharyngitis.  Strep pharyngitis is inflammation and infection in the back of the throat.  This is an infection cause by bacteria and is treated with antibiotics.    I have prescribed Azithromycin 250 mg two tablets today and then one daily for 4 additional days. I sent a zpak due to your allergy list showing you are allergic to PCN.    For throat pain, we recommend over the counter oral pain relief medications such as acetaminophen or aspirin, or anti-inflammatory medications such as ibuprofen or naproxen sodium. Topical treatments such as oral throat lozenges or sprays may be used as needed. Strep infections are not as easily transmitted as other respiratory infections, however we still recommend that you avoid close contact with loved ones, especially the very young and elderly.  Remember to wash your hands thoroughly throughout the day as this is the number one way to prevent the spread of infection and wipe down door knobs and counters with disinfectant.   Home Care: Only take medications as instructed by your medical team. Complete the entire course of an antibiotic. Do not take these medications with alcohol. A steam or ultrasonic humidifier can help congestion.  You can place a towel over your head and breathe in the steam from hot water coming from a faucet. Avoid close contacts especially the very young and the elderly. Cover your mouth when you cough or sneeze. Always remember to wash your hands.  Get Help Right Away If: You develop worsening fever or sinus pain. You develop a severe head ache or visual changes. Your symptoms persist after you have completed your treatment plan.  Make sure you Understand these instructions. Will watch your condition. Will get help right away if you are not  doing well or get worse.   Thank you for choosing an e-visit.  Your e-visit answers were reviewed by a board certified advanced clinical practitioner to complete your personal care plan. Depending upon the condition, your plan could have included both over the counter or prescription medications.  Please review your pharmacy choice. Make sure the pharmacy is open so you can pick up prescription now. If there is a problem, you may contact your provider through [employer] and have the prescription routed to another pharmacy.  Your safety is important to [place]. If you have drug allergies check your prescription carefully.   For the next 24 hours you can use MyChart to ask questions about today's visit, request a non-urgent call back, or ask for a work or school excuse. You will get an email in the next two days asking about your experience. I hope that your e-visit has been valuable and will speed your recovery.

## 2021-08-24 NOTE — Progress Notes (Signed)
I have spent 5 minutes in review of e-visit questionnaire, review and updating patient chart, medical decision making and response to patient.  ° °Paiden Cavell W Kathleena Freeman, NP ° °  °

## 2021-09-26 ENCOUNTER — Telehealth: Payer: 59 | Admitting: Physician Assistant

## 2021-09-26 DIAGNOSIS — J020 Streptococcal pharyngitis: Secondary | ICD-10-CM | POA: Diagnosis not present

## 2021-09-26 MED ORDER — CLINDAMYCIN HCL 300 MG PO CAPS: 300.0000 mg | ORAL_CAPSULE | Freq: Three times a day (TID) | ORAL | 0 refills | Status: DC

## 2021-09-26 NOTE — Progress Notes (Signed)
E-Visit for Sore Throat - Strep Symptoms  We are sorry that you are not feeling well.  Here is how we plan to help!  Based on what you have shared with me it is likely that you have strep pharyngitis.  Strep pharyngitis is inflammation and infection in the back of the throat.  This is an infection cause by bacteria and is treated with antibiotics.  I have prescribed Clindamycin 300 mg three times a day for 10 days. For throat pain, we recommend over the counter oral pain relief medications such as acetaminophen or aspirin, or anti-inflammatory medications such as ibuprofen or naproxen sodium. Topical treatments such as oral throat lozenges or sprays may be used as needed. Strep infections are not as easily transmitted as other respiratory infections, however we still recommend that you avoid close contact with loved ones, especially the very young and elderly.  Remember to wash your hands thoroughly throughout the day as this is the number one way to prevent the spread of infection and wipe down door knobs and counters with disinfectant.   Home Care: Only take medications as instructed by your medical team. Complete the entire course of an antibiotic. Do not take these medications with alcohol. A steam or ultrasonic humidifier can help congestion.  You can place a towel over your head and breathe in the steam from hot water coming from a faucet. Avoid close contacts especially the very young and the elderly. Cover your mouth when you cough or sneeze. Always remember to wash your hands.  Get Help Right Away If: You develop worsening fever or sinus pain. You develop a severe head ache or visual changes. Your symptoms persist after you have completed your treatment plan.  Make sure you Understand these instructions. Will watch your condition. Will get help right away if you are not doing well or get worse.   Thank you for choosing an e-visit.  Your e-visit answers were reviewed by a board  certified advanced clinical practitioner to complete your personal care plan. Depending upon the condition, your plan could have included both over the counter or prescription medications.  Please review your pharmacy choice. Make sure the pharmacy is open so you can pick up prescription now. If there is a problem, you may contact your provider through MyChart messaging and have the prescription routed to another pharmacy.  Your safety is important to us. If you have drug allergies check your prescription carefully.   For the next 24 hours you can use MyChart to ask questions about today's visit, request a non-urgent call back, or ask for a work or school excuse. You will get an email in the next two days asking about your experience. I hope that your e-visit has been valuable and will speed your recovery.  I provided 5 minutes of non face-to-face time during this encounter for chart review and documentation.   

## 2021-10-31 ENCOUNTER — Telehealth: Payer: 59 | Admitting: Physician Assistant

## 2021-10-31 DIAGNOSIS — R3989 Other symptoms and signs involving the genitourinary system: Secondary | ICD-10-CM | POA: Diagnosis not present

## 2021-10-31 MED ORDER — SULFAMETHOXAZOLE-TRIMETHOPRIM 800-160 MG PO TABS: 1.0000 | ORAL_TABLET | Freq: Two times a day (BID) | ORAL | 0 refills | Status: DC

## 2021-10-31 NOTE — Progress Notes (Signed)

## 2021-11-30 ENCOUNTER — Telehealth: Payer: 59 | Admitting: Family

## 2021-11-30 DIAGNOSIS — J069 Acute upper respiratory infection, unspecified: Secondary | ICD-10-CM | POA: Diagnosis not present

## 2021-11-30 MED ORDER — FLUTICASONE PROPIONATE 50 MCG/ACT NA SUSP: 2.0000 | Freq: Every day | NASAL | 6 refills | Status: DC

## 2021-11-30 MED ORDER — BENZONATATE 100 MG PO CAPS: 100.0000 mg | ORAL_CAPSULE | Freq: Three times a day (TID) | ORAL | 0 refills | Status: DC | PRN

## 2021-11-30 NOTE — Progress Notes (Signed)

## 2022-02-24 ENCOUNTER — Telehealth: Payer: 59 | Admitting: Physician Assistant

## 2022-02-24 DIAGNOSIS — U071 COVID-19: Secondary | ICD-10-CM

## 2022-02-24 MED ORDER — ONDANSETRON 4 MG PO TBDP: 4.0000 mg | ORAL_TABLET | Freq: Three times a day (TID) | ORAL | 0 refills | Status: AC | PRN

## 2022-02-24 MED ORDER — NIRMATRELVIR/RITONAVIR (PAXLOVID)TABLET: 3.0000 | ORAL_TABLET | Freq: Two times a day (BID) | ORAL | 0 refills | Status: AC

## 2022-02-24 NOTE — Patient Instructions (Signed)
[name], thank you for joining Margaretann Loveless, PA-C for today's virtual visit.  While this provider is not your primary care provider (PCP), if your PCP is located in our provider database this encounter information will be shared with them immediately following your visit.   A [facility] MyChart account gives you access to today's visit and all your visits, tests, and labs performed at [facility] " click here if you don't have a [facility] MyChart account or go to [email or web address]  Consent: (Patient) Rolene A Litke provided verbal consent for this virtual visit at the beginning of the encounter.  Current Medications:  Current Outpatient Medications:    nirmatrelvir/ritonavir (PAXLOVID) 20 x 150 MG & 10 x 100MG  TABS, Take 3 tablets by mouth 2 (two) times daily for 5 days. (Take nirmatrelvir 150 mg two tablets twice daily for 5 days and ritonavir 100 mg one tablet twice daily for 5 days) Patient GFR is greater than 60, Disp: 30 tablet, Rfl: 0   ondansetron (ZOFRAN-ODT) 4 MG disintegrating tablet, Take 1 tablet (4 mg total) by mouth every 8 (eight) hours as needed., Disp: 20 tablet, Rfl: 0   benzonatate (TESSALON PERLES) 100 MG capsule, Take 1 capsule (100 mg total) by mouth 3 (three) times daily as needed., Disp: 20 capsule, Rfl: 0   clindamycin (CLEOCIN) 300 MG capsule, Take 1 capsule (300 mg total) by mouth 3 (three) times daily., Disp: 30 capsule, Rfl: 0   fluticasone (FLONASE) 50 MCG/ACT nasal spray, Place 2 sprays into both nostrils daily., Disp: 16 g, Rfl: 6   Prenatal Vit-Fe Fumarate-FA (PRENATAL MULTIVITAMIN) TABS, Take 1 tablet by mouth at bedtime. , Disp: , Rfl:    sulfamethoxazole-trimethoprim (BACTRIM DS) 800-160 MG tablet, Take 1 tablet by mouth 2 (two) times daily., Disp: 10 tablet, Rfl: 0   Medications ordered in this encounter:  Meds ordered this encounter  Medications   nirmatrelvir/ritonavir (PAXLOVID) 20 x 150 MG & 10 x 100MG  TABS     Sig: Take 3 tablets by mouth 2 (two) times daily for 5 days. (Take nirmatrelvir 150 mg two tablets twice daily for 5 days and ritonavir 100 mg one tablet twice daily for 5 days) Patient GFR is greater than 60    Dispense:  30 tablet    Refill:  0    Order Specific Question:   Supervising Provider    Answer:      ondansetron (ZOFRAN-ODT) 4 MG disintegrating tablet    Sig: Take 1 tablet (4 mg total) by mouth every 8 (eight) hours as needed.    Dispense:  20 tablet    Refill:  0    Order Specific Question:   Supervising Provider    Answer:   Merrilee Jansky [[id number]]     *If you need refills on other medications prior to your next appointment, please contact your pharmacy*  Follow-Up: Call back or seek an in-person evaluation if the symptoms worsen or if the condition fails to improve as anticipated.  [facility] [phone]  Other Instructions  Covid 19 Isolation Guidelines: Isolate for 5 days from symptom onset, with first day of symptoms counting as Day 0; If fever-free and symptoms are improving can come out of isolation on Day 6, but it is RECOMMENDED to wear a mask from Day 6 - Day 10 anytime you are in close proximity with others or in crowded places. If still having significant symptoms, or fevers are present on Day  5 of symptoms, it is RECOMMENDED to ISOLATE for a full 10 day isolation period.    COVID-19 COVID-19, or coronavirus disease 2019, is an infection that is caused by a new (novel) coronavirus called SARS-CoV-2. COVID-19 can cause many symptoms. In some people, the virus may not cause any symptoms. In others, it may cause mild or severe symptoms. Some people with severe infection develop severe disease. What are the causes? This illness is caused by a virus. The virus may be in the air as tiny specks of fluid (aerosols) or droplets, or it may be on surfaces. You may catch the virus by: Breathing in droplets from an infected  person. Droplets can be spread by a person breathing, speaking, singing, coughing, or sneezing. Touching something, like a table or a doorknob, that has virus on it (is contaminated) and then touching your mouth, nose, or eyes. What increases the risk? Risk for infection: You are more likely to get infected with the COVID-19 virus if: You are within 6 ft (1.8 m) of a person with COVID-19 for 15 minutes or longer. You are providing care for a person who is infected with COVID-19. You are in close personal contact with other people. Close personal contact includes hugging, kissing, or sharing eating or drinking utensils. Risk for serious illness caused by COVID-19: You are more likely to get seriously ill from the COVID-19 virus if: You have cancer. You have a long-term (chronic) disease, such as: Chronic lung disease. This includes pulmonary embolism, chronic obstructive pulmonary disease, and cystic fibrosis. Long-term disease that lowers your body's ability to fight infection (immunocompromise). Serious cardiac conditions, such as heart failure, coronary artery disease, or cardiomyopathy. Diabetes. Chronic kidney disease. Liver diseases. These include cirrhosis, nonalcoholic fatty liver disease, alcoholic liver disease, or autoimmune hepatitis. You have obesity. You are pregnant or were recently pregnant. You have sickle cell disease. What are the signs or symptoms? Symptoms of this condition can range from mild to severe. Symptoms may appear any time from 2 to 14 days after being exposed to the virus. They include: Fever or chills. Shortness of breath or trouble breathing. Feeling tired or very tired. Headaches, body aches, or muscle aches. Runny or stuffy nose, sneezing, coughing, or sore throat. New loss of taste or smell. This is rare. Some people may also have stomach problems, such as nausea, vomiting, or diarrhea. Other people may not have any symptoms of COVID-19. How is this  diagnosed? This condition may be diagnosed by testing samples to check for the COVID-19 virus. The most common tests are the PCR test and the antigen test. Tests may be done in the lab or at home. They include: Using a swab to take a sample of fluid from the back of your nose and throat (nasopharyngeal fluid), from your nose, or from your throat. Testing a sample of saliva from your mouth. Testing a sample of coughed-up mucus from your lungs (sputum). How is this treated? Treatment for COVID-19 infection depends on the severity of the condition. Mild symptoms can be managed at home with rest, fluids, and over-the-counter medicines. Serious symptoms may be treated in a hospital intensive care unit (ICU). Treatment in the ICU may include: Supplemental oxygen. Extra oxygen is given through a tube in the nose, a face mask, or a hood. Medicines. These may include: Antivirals, such as monoclonal antibodies. These help your body fight off certain viruses that can cause disease. Anti-inflammatories, such as corticosteroids. These reduce inflammation and suppress the immune system.  Antithrombotics. These prevent or treat blood clots, if they develop. Convalescent plasma. This helps boost your immune system, if you have an underlying immunosuppressive condition or are getting immunosuppressive treatments. Prone positioning. This means you will lie on your stomach. This helps oxygen to get into your lungs. Infection control measures. If you are at risk for more serious illness caused by COVID-19, your health care provider may prescribe two long-acting monoclonal antibodies, given together every 6 months. How is this prevented? To protect yourself: Use preventive medicine (pre-exposure prophylaxis). You may get pre-exposure prophylaxis if you have moderate or severe immunocompromise. Get vaccinated. Anyone 2 months old or older who meets guidelines can get a COVID-19 vaccine or vaccine series. This includes  people who are pregnant or making breast milk (lactating). Get an added dose of COVID-19 vaccine after your first vaccine or vaccine series if you have moderate to severe immunocompromise. This applies if you have had a solid organ transplant or have been diagnosed with an immunocompromising condition. You should get the added dose 4 weeks after you got the first COVID-19 vaccine or vaccine series. If you get an mRNA vaccine, you will need a 3-dose primary series. If you get the J&J/Janssen vaccine, you will need a 2-dose primary series, with the second dose being an mRNA vaccine. Talk to your health care provider about getting experimental monoclonal antibodies. This treatment is approved under emergency use authorization to prevent severe illness before or after being exposed to the COVID-19 virus. You may be given monoclonal antibodies if: You have moderate or severe immunocompromise. This includes treatments that lower your immune response. People with immunocompromise may not develop protection against COVID-19 when they are vaccinated. You cannot be vaccinated. You may not get a vaccine if you have a severe allergic reaction to the vaccine or its components. You are not fully vaccinated. You are in a facility where COVID-19 is present and: Are in close contact with a person who is infected with the COVID-19 virus. Are at high risk of being exposed to the COVID-19 virus. You are at risk of illness from new variants of the COVID-19 virus. To protect others: If you have symptoms of COVID-19, take steps to prevent the virus from spreading to others. Stay home. Leave your house only to get medical care. Do not use public transit, if possible. Do not travel while you are sick. Wash your hands often with soap and water for at least 20 seconds. If soap and water are not available, use alcohol-based hand sanitizer. Make sure that all people in your household wash their hands well and often. Cough or  sneeze into a tissue or your sleeve or elbow. Do not cough or sneeze into your hand or into the air. Where to find more information Centers for Disease Control and Prevention: CharmC[employer]be World Health Organization: https://www.castaneda.info/ Get help right away if: You have trouble breathing. You have pain or pressure in your chest. You are confused. You have bluish lips and fingernails. You have trouble waking from sleep. You have symptoms that get worse. These symptoms may be an emergency. Get help right away. Call 911. Do not wait to see if the symptoms will go away. Do not drive yourself to the hospital. Summary COVID-19 is an infection that is caused by a new coronavirus. Sometimes, there are no symptoms. Other times, symptoms range from mild to severe. Some people with a severe COVID-19 infection develop severe disease. The virus that causes COVID-19 can spread from person to  person through droplets or aerosols from breathing, speaking, singing, coughing, or sneezing. Mild symptoms of COVID-19 can be managed at home with rest, fluids, and over-the-counter medicines. This information is not intended to replace advice given to you by your health care provider. Make sure you discuss any questions you have with your health care provider. Document Revised: 01/07/2021 Document Reviewed: 01/09/2021 Elsevier Patient Education  2023 Elsevier Inc.    If you have been instructed to have an in-person evaluation today at a local Urgent Care facility, please use the link below. It will take you to a list of all of our available [facility], including address, phone number and hours of operation. Please do not delay care.  [facility]  If you or a family member do not have a primary care provider, use the link below to schedule a visit and establish care. When you choose a [facility] primary care physician or advanced practice provider, you gain a  long-term partner in health. Find a Primary Care Provider  Learn more about [facility]'s in-office and virtual care options: [facility] - Get Care Now

## 2022-02-24 NOTE — Progress Notes (Signed)
Virtual Visit Consent   Debbie French, you are scheduled for a virtual visit with a [facility] provider today. Just as with appointments in the office, your consent must be obtained to participate. Your consent will be active for this visit and any virtual visit you may have with one of our providers in the next 365 days. If you have a MyChart account, a copy of this consent can be sent to you electronically.  As this is a virtual visit, video technology does not allow for your provider to perform a traditional examination. This may limit your provider's ability to fully assess your condition. If your provider identifies any concerns that need to be evaluated in person or the need to arrange testing (such as labs, EKG, etc.), we will make arrangements to do so. Although advances in technology are sophisticated, we cannot ensure that it will always work on either your end or our end. If the connection with a video visit is poor, the visit may have to be switched to a telephone visit. With either a video or telephone visit, we are not always able to ensure that we have a secure connection.  By engaging in this virtual visit, you consent to the provision of healthcare and authorize for your insurance to be billed (if applicable) for the services provided during this visit. Depending on your insurance coverage, you may receive a charge related to this service.  I need to obtain your verbal consent now. Are you willing to proceed with your visit today? Debbie French has provided verbal consent on 02/24/2022 for a virtual visit (video or telephone). Mar Daring, PA-C  Date: 02/24/2022 11:11 AM  Virtual Visit via Video Note   I, Mar Daring, connected with  Debbie French  ([id number], Jan 24, 1981) on 02/24/22 at 11:00 AM EST by a video-enabled telemedicine application and verified that I am speaking with the correct person using two identifiers.  Location: Patient: Virtual Visit  Location Patient: Home Provider: Virtual Visit Location Provider: Home Office   I discussed the limitations of evaluation and management by telemedicine and the availability of in person appointments. The patient expressed understanding and agreed to proceed.    History of Present Illness: Debbie French is a 42 y.o. who identifies as a female who was assigned female at birth, and is being seen today for Covid 49.  HPI: URI  This is a new problem. Episode onset: Symptoms started yesterday; tested positive for Covid 19 this morning. The problem has been gradually worsening. There has been no fever (99.8). The fever has been present for Less than 1 day. Associated symptoms include chest pain, congestion, headaches, nausea and sinus pain. Pertinent negatives include no coughing, diarrhea, ear pain, plugged ear sensation, rhinorrhea, sore throat or vomiting. Associated symptoms comments: Myalgias, burning in nose, elevated resting hr. She has tried NSAIDs, increased fluids and sleep for the symptoms. The treatment provided no relief.     Problems:  Patient Active Problem List   Diagnosis Date Noted   Encounter for induction of labor 08/03/2018   Pregnancy of unknown anatomic location 12/30/2016   Active labor 05/08/2014    Allergies:  Allergies  Allergen Reactions   Amoxicillin Hives   Penicillins Hives   Medications:  Current Outpatient Medications:    nirmatrelvir/ritonavir (PAXLOVID) 20 x 150 MG & 10 x 100MG  TABS, Take 3 tablets by mouth 2 (two) times daily for 5 days. (Take nirmatrelvir 150 mg two tablets twice daily for  5 days and ritonavir 100 mg one tablet twice daily for 5 days) Patient GFR is greater than 60, Disp: 30 tablet, Rfl: 0   ondansetron (ZOFRAN-ODT) 4 MG disintegrating tablet, Take 1 tablet (4 mg total) by mouth every 8 (eight) hours as needed., Disp: 20 tablet, Rfl: 0   benzonatate (TESSALON PERLES) 100 MG capsule, Take 1 capsule (100 mg total) by mouth 3 (three) times  daily as needed., Disp: 20 capsule, Rfl: 0   clindamycin (CLEOCIN) 300 MG capsule, Take 1 capsule (300 mg total) by mouth 3 (three) times daily., Disp: 30 capsule, Rfl: 0   fluticasone (FLONASE) 50 MCG/ACT nasal spray, Place 2 sprays into both nostrils daily., Disp: 16 g, Rfl: 6   Prenatal Vit-Fe Fumarate-FA (PRENATAL MULTIVITAMIN) TABS, Take 1 tablet by mouth at bedtime. , Disp: , Rfl:    sulfamethoxazole-trimethoprim (BACTRIM DS) 800-160 MG tablet, Take 1 tablet by mouth 2 (two) times daily., Disp: 10 tablet, Rfl: 0  Observations/Objective: Patient is well-developed, well-nourished in no acute distress.  Resting comfortably at home.  Head is normocephalic, atraumatic.  No labored breathing. Speech is clear and coherent with logical content.  Patient is alert and oriented at baseline.    Assessment and Plan: 1. COVID-19 - nirmatrelvir/ritonavir (PAXLOVID) 20 x 150 MG & 10 x 100MG  TABS; Take 3 tablets by mouth 2 (two) times daily for 5 days. (Take nirmatrelvir 150 mg two tablets twice daily for 5 days and ritonavir 100 mg one tablet twice daily for 5 days) Patient GFR is greater than 60  Dispense: 30 tablet; Refill: 0 - ondansetron (ZOFRAN-ODT) 4 MG disintegrating tablet; Take 1 tablet (4 mg total) by mouth every 8 (eight) hours as needed.  Dispense: 20 tablet; Refill: 0  - Continue OTC symptomatic management of choice - Will send OTC vitamins and supplement information through [facility] - Paxlovid and Zofran prescribed (patient aware of ADR with AKI and agrees to proceed; has had paxlovid in past without issue) - Patient enrolled in MyChart symptom monitoring - Push fluids - Rest as needed - Discussed return precautions and when to seek in-person evaluation, sent via [facility] as well   Follow Up Instructions: I discussed the assessment and treatment plan with the patient. The patient was provided an opportunity to ask questions and all were answered. The patient agreed with the plan and  demonstrated an understanding of the instructions.  A copy of instructions were sent to the patient via MyChart unless otherwise noted below.    The patient was advised to call back or seek an in-person evaluation if the symptoms worsen or if the condition fails to improve as anticipated.  Time:  I spent 11 minutes with the patient via telehealth technology discussing the above problems/concerns.    Mar Daring, PA-C

## 2022-03-13 ENCOUNTER — Telehealth: Payer: 59 | Admitting: Nurse Practitioner

## 2022-03-13 DIAGNOSIS — N3000 Acute cystitis without hematuria: Secondary | ICD-10-CM | POA: Diagnosis not present

## 2022-03-13 MED ORDER — NITROFURANTOIN MONOHYD MACRO 100 MG PO CAPS: 100.0000 mg | ORAL_CAPSULE | Freq: Two times a day (BID) | ORAL | 0 refills | Status: AC

## 2022-03-13 NOTE — Progress Notes (Signed)
E-Visit for Urinary Problems  We are sorry that you are not feeling well.  Here is how we plan to help!  Based on what you shared with me it looks like you most likely have a simple urinary tract infection.  A UTI (Urinary Tract Infection) is a bacterial infection of the bladder.  Most cases of urinary tract infections are simple to treat but a key part of your care is to encourage you to drink plenty of fluids and watch your symptoms carefully.  I have prescribed MacroBid 100 mg twice a day for 5 days.  Your symptoms should gradually improve. Call us if the burning in your urine worsens, you develop worsening fever, back pain or pelvic pain or if your symptoms do not resolve after completing the antibiotic.  Urinary tract infections can be prevented by drinking plenty of water to keep your body hydrated.  Also be sure when you wipe, wipe from front to back and don't hold it in!  If possible, empty your bladder every 4 hours.  HOME CARE Drink plenty of fluids Compete the full course of the antibiotics even if the symptoms resolve Remember, when you need to go.go. Holding in your urine can increase the likelihood of getting a UTI! GET HELP RIGHT AWAY IF: You cannot urinate You get a high fever Worsening back pain occurs You see blood in your urine You feel sick to your stomach or throw up You feel like you are going to pass out  MAKE SURE YOU  Understand these instructions. Will watch your condition. Will get help right away if you are not doing well or get worse.   Thank you for choosing an e-visit.  Your e-visit answers were reviewed by a board certified advanced clinical practitioner to complete your personal care plan. Depending upon the condition, your plan could have included both over the counter or prescription medications.  Please review your pharmacy choice. Make sure the pharmacy is open so you can pick up prescription now. If there is a problem, you may contact your  provider through [employer] and have the prescription routed to another pharmacy.  Your safety is important to [place]. If you have drug allergies check your prescription carefully.   For the next 24 hours you can use MyChart to ask questions about today's visit, request a non-urgent call back, or ask for a work or school excuse. You will get an email in the next two days asking about your experience. I hope that your e-visit has been valuable and will speed your recovery.   Meds ordered this encounter  Medications   nitrofurantoin, macrocrystal-monohydrate, (MACROBID) 100 MG capsule    Sig: Take 1 capsule (100 mg total) by mouth 2 (two) times daily for 5 days.    Dispense:  10 capsule    Refill:  0     I spent approximately 5 minutes reviewing the patient's history, current symptoms and coordinating their care today.

## 2022-09-28 ENCOUNTER — Telehealth: Payer: 59 | Admitting: Nurse Practitioner

## 2022-09-28 DIAGNOSIS — J0140 Acute pansinusitis, unspecified: Secondary | ICD-10-CM

## 2022-09-28 MED ORDER — DOXYCYCLINE HYCLATE 100 MG PO TABS: 100.0000 mg | ORAL_TABLET | Freq: Two times a day (BID) | ORAL | 0 refills | Status: AC

## 2022-09-28 NOTE — Progress Notes (Signed)
Virtual Visit Consent   Debbie French, you are scheduled for a virtual visit with a [facility] provider today. Just as with appointments in the office, your consent must be obtained to participate. Your consent will be active for this visit and any virtual visit you may have with one of our providers in the next 365 days. If you have a MyChart account, a copy of this consent can be sent to you electronically.  As this is a virtual visit, video technology does not allow for your provider to perform a traditional examination. This may limit your provider's ability to fully assess your condition. If your provider identifies any concerns that need to be evaluated in person or the need to arrange testing (such as labs, EKG, etc.), we will make arrangements to do so. Although advances in technology are sophisticated, we cannot ensure that it will always work on either your end or our end. If the connection with a video visit is poor, the visit may have to be switched to a telephone visit. With either a video or telephone visit, we are not always able to ensure that we have a secure connection.  By engaging in this virtual visit, you consent to the provision of healthcare and authorize for your insurance to be billed (if applicable) for the services provided during this visit. Depending on your insurance coverage, you may receive a charge related to this service.  I need to obtain your verbal consent now. Are you willing to proceed with your visit today? [employer] has provided verbal consent on 09/28/2022 for a virtual visit (video or telephone). Viviano Simas, FNP  Date: 09/28/2022 12:44 PM  Virtual Visit via Video Note   I, Viviano Simas, connected with  Debbie French  ([id number], Dec 19, 1980) on 09/28/22 at 12:45 PM EDT by a video-enabled telemedicine application and verified that I am speaking with the correct person using two identifiers.  Location: Patient: Virtual Visit Location Patient:  Home Provider: Virtual Visit Location Provider: Home Office   I discussed the limitations of evaluation and management by telemedicine and the availability of in person appointments. The patient expressed understanding and agreed to proceed.    History of Present Illness: Debbie French is a 42 y.o. who identifies as a female who was assigned female at birth, and is being seen today for upper respiratory symptoms she has been suffering for from the past week   She started one week ago with body aches, subjective fever, sinus congestion   She was feeling better yesterday and then feels as though she rebounded today with worsening symptoms, more frontal sinus pressure and thicker drainage from her sinuses   Her husband has similar symptoms and did test for COVID several times and was negative  She has also had some tenderness around her nose   She used Afrin for three days last week  She has used Saline nasal spray  Has also been using Mucinex for relief   She is not pregnant or breastfeeding   Problems:  Patient Active Problem List   Diagnosis Date Noted   Encounter for induction of labor 08/03/2018   Pregnancy of unknown anatomic location 12/30/2016   Active labor 05/08/2014    Allergies:  Allergies  Allergen Reactions   Amoxicillin Hives   Penicillins Hives   Medications:  Current Outpatient Medications:    benzonatate (TESSALON PERLES) 100 MG capsule, Take 1 capsule (100 mg total) by mouth 3 (three) times daily as needed.,  Disp: 20 capsule, Rfl: 0   clindamycin (CLEOCIN) 300 MG capsule, Take 1 capsule (300 mg total) by mouth 3 (three) times daily., Disp: 30 capsule, Rfl: 0   fluticasone (FLONASE) 50 MCG/ACT nasal spray, Place 2 sprays into both nostrils daily., Disp: 16 g, Rfl: 6   ondansetron (ZOFRAN-ODT) 4 MG disintegrating tablet, Take 1 tablet (4 mg total) by mouth every 8 (eight) hours as needed., Disp: 20 tablet, Rfl: 0   Prenatal Vit-Fe Fumarate-FA (PRENATAL  MULTIVITAMIN) TABS, Take 1 tablet by mouth at bedtime. , Disp: , Rfl:    sulfamethoxazole-trimethoprim (BACTRIM DS) 800-160 MG tablet, Take 1 tablet by mouth 2 (two) times daily., Disp: 10 tablet, Rfl: 0  Observations/Objective: Patient is well-developed, well-nourished in no acute distress.  Resting comfortably  at home.  Head is normocephalic, atraumatic.  No labored breathing.  Speech is clear and coherent with logical content.  Patient is alert and oriented at baseline.    Assessment and Plan:  1. Acute non-recurrent pansinusitis  - doxycycline (VIBRA-TABS) 100 MG tablet; Take 1 tablet (100 mg total) by mouth 2 (two) times daily for 10 days.  Dispense: 20 tablet; Refill: 0 *take with food     May continue mucinex as needed  Push fluids  Aquaphor for comfort to nasal passages    Follow Up Instructions: I discussed the assessment and treatment plan with the patient. The patient was provided an opportunity to ask questions and all were answered. The patient agreed with the plan and demonstrated an understanding of the instructions.  A copy of instructions were sent to the patient via MyChart unless otherwise noted below.    The patient was advised to call back or seek an in-person evaluation if the symptoms worsen or if the condition fails to improve as anticipated.  Time:  I spent 11 minutes with the patient via telehealth technology discussing the above problems/concerns.    Viviano Simas, FNP

## 2022-10-25 ENCOUNTER — Telehealth: Payer: 59 | Admitting: Family

## 2022-10-25 DIAGNOSIS — B9689 Other specified bacterial agents as the cause of diseases classified elsewhere: Secondary | ICD-10-CM

## 2022-10-25 DIAGNOSIS — J208 Acute bronchitis due to other specified organisms: Secondary | ICD-10-CM

## 2022-10-25 MED ORDER — DOXYCYCLINE HYCLATE 100 MG PO TABS: 100.0000 mg | ORAL_TABLET | Freq: Two times a day (BID) | ORAL | 0 refills | Status: DC

## 2022-10-25 NOTE — Progress Notes (Signed)
Virtual Visit Consent   Debbie French, you are scheduled for a virtual visit with a [place] provider today. Just as with appointments in the office, your consent must be obtained to participate. Your consent will be active for this visit and any virtual visit you may have with one of our providers in the next 365 days. If you have a MyChart account, a copy of this consent can be sent to you electronically.  As this is a virtual visit, video technology does not allow for your provider to perform a traditional examination. This may limit your provider's ability to fully assess your condition. If your provider identifies any concerns that need to be evaluated in person or the need to arrange testing (such as labs, EKG, etc.), we will make arrangements to do so. Although advances in technology are sophisticated, we cannot ensure that it will always work on either your end or our end. If the connection with a video visit is poor, the visit may have to be switched to a telephone visit. With either a video or telephone visit, we are not always able to ensure that we have a secure connection.  By engaging in this virtual visit, you consent to the provision of healthcare and authorize for your insurance to be billed (if applicable) for the services provided during this visit. Depending on your insurance coverage, you may receive a charge related to this service.  I need to obtain your verbal consent now. Are you willing to proceed with your visit today? Debbie French has provided verbal consent on 10/25/2022 for a virtual visit (video or telephone). Debbie Rodney, FNP  Date: 10/25/2022 9:09 AM  Virtual Visit via Video Note   I, Debbie French, connected with  Debbie French  ([id number], [birth year]) on 10/25/22 at  9:00 AM EDT by a video-enabled telemedicine application and verified that I am speaking with the correct person using two identifiers.  Location: Patient: Virtual Visit Location Patient:  Home Provider: Virtual Visit Location Provider: Home Office   I discussed the limitations of evaluation and management by telemedicine and the availability of in person appointments. The patient expressed understanding and agreed to proceed.    History of Present Illness: Debbie French is a 42 y.o. who identifies as a female who was assigned female at birth, and is being seen today for cough. She reports her daughter was diagnosed with  pneumonia last week.   HPI: Cough This is a new problem. The current episode started in the past 7 days. The problem has been gradually worsening. The problem occurs every few minutes. The cough is Productive of sputum. Associated symptoms include a fever, headaches and myalgias. Pertinent negatives include no chills, ear congestion, ear pain, shortness of breath or wheezing. She has tried rest and OTC cough suppressant for the symptoms. The treatment provided mild relief.    Problems:  Patient Active Problem List   Diagnosis Date Noted   Encounter for induction of labor 08/03/2018   Pregnancy of unknown anatomic location 12/30/2016   Active labor 05/08/2014    Allergies:  Allergies  Allergen Reactions   Amoxicillin Hives   Penicillins Hives   Medications:   Observations/Objective: Patient is well-developed, well-nourished in no acute distress.  Resting comfortably  at home.  Head is normocephalic, atraumatic.  No labored breathing.  Speech is clear and coherent with logical content.  Patient is alert and oriented at baseline.  Dry cough  Assessment and Plan: 1. Acute bacterial  bronchitis - doxycycline (VIBRA-TABS) 100 MG tablet; Take 1 tablet (100 mg total) by mouth 2 (two) times daily.  Dispense: 20 tablet; Refill: 0  - Take meds as prescribed - Use a cool mist humidifier  -Use saline nose sprays frequently -Force fluids -For any cough or congestion  Use plain Mucinex- regular strength or max strength is fine -For fever or aces or  pains- take tylenol or ibuprofen. -Throat lozenges if help -Follow up if symptoms worsen or do not improve   Follow Up Instructions: I discussed the assessment and treatment plan with the patient. The patient was provided an opportunity to ask questions and all were answered. The patient agreed with the plan and demonstrated an understanding of the instructions.  A copy of instructions were sent to the patient via MyChart unless otherwise noted below.     The patient was advised to call back or seek an in-person evaluation if the symptoms worsen or if the condition fails to improve as anticipated.  Time:  I spent 9 minutes with the patient via telehealth technology discussing the above problems/concerns.    Debbie Rodney, FNP

## 2023-02-15 LAB — HM MAMMOGRAPHY

## 2023-02-24 ENCOUNTER — Ambulatory Visit: Payer: 59 | Admitting: Family Medicine

## 2023-02-24 ENCOUNTER — Encounter: Payer: Self-pay | Admitting: Family Medicine

## 2023-02-24 VITALS — BP 133/84 | HR 87 | Ht 62.0 in | Wt 149.8 lb

## 2023-02-24 DIAGNOSIS — E7800 Pure hypercholesterolemia, unspecified: Secondary | ICD-10-CM | POA: Diagnosis not present

## 2023-02-24 DIAGNOSIS — F411 Generalized anxiety disorder: Secondary | ICD-10-CM

## 2023-02-24 DIAGNOSIS — Z8759 Personal history of other complications of pregnancy, childbirth and the puerperium: Secondary | ICD-10-CM | POA: Diagnosis not present

## 2023-02-24 DIAGNOSIS — Z0000 Encounter for general adult medical examination without abnormal findings: Secondary | ICD-10-CM | POA: Diagnosis not present

## 2023-02-24 DIAGNOSIS — E663 Overweight: Secondary | ICD-10-CM | POA: Diagnosis not present

## 2023-02-24 DIAGNOSIS — Z6827 Body mass index (BMI) 27.0-27.9, adult: Secondary | ICD-10-CM

## 2023-02-24 MED ORDER — TIRZEPATIDE-WEIGHT MANAGEMENT 2.5 MG/0.5ML ~~LOC~~ SOLN: 2.5000 mg | SUBCUTANEOUS | 0 refills | Status: DC

## 2023-02-24 NOTE — Progress Notes (Unsigned)
Office Note 02/25/2023  CC:  Chief Complaint  Patient presents with   Establish Care    Pt wants to discuss weight management.  No previous PCP Tdap: 2020 Pap: 02/15/23   HPI:  Debbie French is a 43 y.o.  female who is here to establish care. Patient's most recent primary MD: none Old records in epic/[facility] EMR were reviewed prior to or during today's visit.   Per GYN MD: Takes 5mg  lexapro GAD. Recently has been taking wellbutrin xl 150mg  daily for 1 week but this will increase to 300 mg in 1 wk.  Very recent Pap showed some abnormality but HPV testing negative so she was told special follow-up was needed. Mammogram last week normal.  Eimi exercises every day.  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  She does good calorie-burning workouts. She eats a very sensible diet. She is frustrated with the plateau she has reached regarding weight loss. She is aiming for 125 to 130 pounds.  Currently weighs about 150 pounds.  Has done some research and would like to do a trial of Zepbound.  She has a history of pregnancy-induced hypertension.  She did not have preeclampsia. Home blood pressure monitoring regularly has been normal.   Past Medical History:  Diagnosis Date   Allergic rhinitis    Bilateral knee pain    Cystic fibrosis carrier    GAD (generalized anxiety disorder)    lexapro 5 qd helpful, 10 qd too much emotional blunting   History of Lyme disease    History of pregnancy induced hypertension    Hypercholesterolemia     Past Surgical History:  Procedure Laterality Date   LAPAROSCOPY N/A 12/30/2016   Procedure: LAPAROSCOPY OPERATIVE;  Surgeon: Ranae Pila, MD;  Location: [facility];  Service: Gynecology;  Laterality: N/A;   UNILATERAL SALPINGECTOMY Left 12/30/2016   Procedure: UNILATERAL SALPINGECTOMY;  Surgeon: Ranae Pila, MD;  Location: [facility];  Service: Gynecology;  Laterality: Left;    Family History  Problem Relation Age of Onset   Colon cancer  Maternal Grandmother    Heart disease Maternal Grandfather    Rheum arthritis Maternal Grandfather    Diabetes Paternal Grandmother    Cancer Paternal Grandfather    Dwarfism Other     Social History   Socioeconomic History   Marital status: Married    Spouse name: Not on file   Number of children: Not on file   Years of education: Not on file   Highest education level: Professional school degree (e.g., MD, DDS, DVM, JD)  Occupational History   Not on file  Tobacco Use   Smoking status: Never   Smokeless tobacco: Never  Vaping Use   Vaping status: Never Used  Substance and Sexual Activity   Alcohol use: No   Drug use: No   Sexual activity: Yes    Partners: Male    Birth control/protection: None  Other Topics Concern   Not on file  Social History Narrative   Married, 2 sons and 1 daughter.   [employer] Forensic psychologist.   Cheerleader in college.  Excellent ice skater.   No tob or alc   Social Drivers of Corporate investment banker Strain: Low Risk  (02/24/2023)   Overall Financial Resource Strain (CARDIA)    Difficulty of Paying Living Expenses: Not hard at all  Food Insecurity: No Food Insecurity (02/24/2023)   Hunger Vital Sign    Worried About Running Out of Food in the Last Year: Never true  Ran Out of Food in the Last Year: Never true  Transportation Needs: No Transportation Needs (02/24/2023)   PRAPARE - Administrator, Civil Service (Medical): No    Lack of Transportation (Non-Medical): No  Physical Activity: Sufficiently Active (02/24/2023)   Exercise Vital Sign    Days of Exercise per Week: 4 days    Minutes of Exercise per Session: 60 min  Stress: No Stress Concern Present (02/24/2023)   [employer] of Occupational Health - Occupational Stress Questionnaire    Feeling of Stress : Only a little  Social Connections: Socially Integrated (02/24/2023)   Social Connection and Isolation Panel [NHANES]    Frequency of Communication with Friends  and Family: Three times a week    Frequency of Social Gatherings with Friends and Family: Three times a week    Attends Religious Services: More than 4 times per year    Active Member of Clubs or Organizations: Yes    Attends Banker Meetings: More than 4 times per year    Marital Status: Married  Catering manager Violence: Not At Risk (07/29/2018)   Humiliation, Afraid, Rape, and Kick questionnaire    Fear of Current or Ex-Partner: No    Emotionally Abused: No    Physically Abused: No    Sexually Abused: No    Outpatient Encounter Medications as of 02/24/2023  Medication Sig   buPROPion (WELLBUTRIN XL) 150 MG 24 hr tablet Take 150 mg by mouth daily.   escitalopram (LEXAPRO) 10 MG tablet Take 5 mg by mouth daily.   Prenatal Vit-Fe Fumarate-FA (PRENATAL MULTIVITAMIN) TABS Take 1 tablet by mouth at bedtime.    [DISCONTINUED] tirzepatide (ZEPBOUND) 2.5 MG/0.5ML injection vial Inject 2.5 mg into the skin once a week.   ondansetron (ZOFRAN-ODT) 4 MG disintegrating tablet Take 1 tablet (4 mg total) by mouth every 8 (eight) hours as needed. (Patient not taking: Reported on 02/24/2023)   tirzepatide (ZEPBOUND) 2.5 MG/0.5ML injection vial Inject 2.5 mg into the skin once a week.   [DISCONTINUED] benzonatate (TESSALON PERLES) 100 MG capsule Take 1 capsule (100 mg total) by mouth 3 (three) times daily as needed.   [DISCONTINUED] clindamycin (CLEOCIN) 300 MG capsule Take 1 capsule (300 mg total) by mouth 3 (three) times daily.   [DISCONTINUED] doxycycline (VIBRA-TABS) 100 MG tablet Take 1 tablet (100 mg total) by mouth 2 (two) times daily.   [DISCONTINUED] fluticasone (FLONASE) 50 MCG/ACT nasal spray Place 2 sprays into both nostrils daily.   [DISCONTINUED] sulfamethoxazole-trimethoprim (BACTRIM DS) 800-160 MG tablet Take 1 tablet by mouth 2 (two) times daily.   No facility-administered encounter medications on file as of 02/24/2023.    Allergies  Allergen Reactions   Amoxicillin Hives    Penicillins Hives    Review of Systems  Constitutional:  Negative for appetite change, chills, fatigue and fever.  HENT:  Negative for congestion, dental problem, ear pain and sore throat.   Eyes:  Negative for discharge, redness and visual disturbance.  Respiratory:  Negative for cough, chest tightness, shortness of breath and wheezing.   Cardiovascular:  Negative for chest pain, palpitations and leg swelling.  Gastrointestinal:  Negative for abdominal pain, blood in stool, diarrhea, nausea and vomiting.  Genitourinary:  Negative for difficulty urinating, dysuria, flank pain, frequency, hematuria and urgency.  Musculoskeletal:  Negative for arthralgias, back pain, joint swelling, myalgias and neck stiffness.  Skin:  Negative for pallor and rash.  Neurological:  Negative for dizziness, speech difficulty, weakness and headaches.  Hematological:  Negative for adenopathy. Does not bruise/bleed easily.  Psychiatric/Behavioral:  Negative for confusion and sleep disturbance. The patient is not nervous/anxious.     PE; Blood pressure 133/84, pulse 87, height 5\' 2"  (1.575 m), weight 149 lb 12.8 oz (67.9 kg), last menstrual period 02/22/2023, SpO2 100%, unknown if currently breastfeeding. Body mass index is 27.4 kg/m.  Physical Exam Exam chaperoned by Cloe Motsinger, CMA  Gen: Alert, well appearing.  Patient is oriented to person, place, time, and situation. AFFECT: pleasant, lucid thought and speech. ENT: Ears: EACs clear, normal epithelium.  TMs with good light reflex and landmarks bilaterally.  Eyes: no injection, icteris, swelling, or exudate.  EOMI, PERRLA. Nose: no drainage or turbinate edema/swelling.  No injection or focal lesion.  Mouth: lips without lesion/swelling.  Oral mucosa pink and moist.  Dentition intact and without obvious caries or gingival swelling.  Oropharynx without erythema, exudate, or swelling.  Neck: supple/nontender.  No LAD, mass, or TM.  Carotid pulses 2+  bilaterally, without bruits. CV: RRR, no m/r/g.   LUNGS: CTA bilat, nonlabored resps, good aeration in all lung fields. ABD: soft, NT, ND, BS normal.  No hepatospenomegaly or mass.  No bruits. EXT: no clubbing, cyanosis, or edema.  Musculoskeletal: no joint swelling, erythema, warmth, or tenderness.  ROM of all joints intact. Skin - no sores or suspicious lesions or rashes or color changes   Pertinent labs:  Last CBC Lab Results  Component Value Date   WBC 6.4 02/24/2023   HGB 13.4 02/24/2023   HCT 40.8 02/24/2023   MCV 89.3 02/24/2023   MCH 29.3 02/24/2023   RDW 12.5 02/24/2023   PLT 218 02/24/2023   Last metabolic panel Lab Results  Component Value Date   GLUCOSE 86 02/24/2023   NA 140 02/24/2023   K 4.3 02/24/2023   CL 106 02/24/2023   CO2 26 02/24/2023   BUN 20 02/24/2023   CREATININE 0.72 02/24/2023   GFRNONAA >60 08/05/2018   CALCIUM 9.4 02/24/2023   PROT 6.8 02/24/2023   ALBUMIN 2.5 (L) 08/05/2018   BILITOT 0.3 02/24/2023   ALKPHOS 70 08/05/2018   AST 17 02/24/2023   ALT 13 02/24/2023   ANIONGAP 8 08/05/2018   ASSESSMENT AND PLAN:   New patient, establishing care.  1.  Health maintenance exam: Reviewed age and gender appropriate health maintenance issues (prudent diet, regular exercise, health risks of tobacco and excessive alcohol, use of seatbelts, fire alarms in home, use of sunscreen).  Also reviewed age and gender appropriate health screening as well as vaccine recommendations. Vaccines: Tdap UTD 2020.  Flu and COVID up-to-date. Labs: Health panel and hemoglobin A1c today.  Not fasting. Cervical ca screening: Gets annual Pap smears with GYN.  This year's Pap came back slightly abnormal but HPV testing was negative so she was told all was okay. Breast ca screening: Mammogram this year normal at GYN. Colon ca screening: average risk patient= as per latest guidelines, start screening at 17 yrs of age.  #2 Overweight (BMI 27).  Difficulty losing weight  despite ideal exercise and dietary habits. Interested in [place]. Prescription for 2.5 mg weekly dose was printed today.  #3 GAD. Continue Lexapro 5 mg a day. Continue the plan for increasing Wellbutrin XL to 300 mg a day as per her GYN MD's recent plan.    An After Visit Summary was printed and given to the patient.  Return in about 4 weeks (around 03/24/2023) for f/u wt mgmt.  Signed:  Santiago Bumpers, MD  02/25/2023   

## 2023-02-25 ENCOUNTER — Encounter: Payer: Self-pay | Admitting: Family Medicine

## 2023-02-25 LAB — CBC WITH DIFFERENTIAL/PLATELET
Absolute Lymphocytes: 1677 {cells}/uL (ref 850–3900)
Absolute Monocytes: 614 {cells}/uL (ref 200–950)
Basophils Absolute: 38 {cells}/uL (ref 0–200)
Basophils Relative: 0.6 %
Eosinophils Absolute: 90 {cells}/uL (ref 15–500)
Eosinophils Relative: 1.4 %
HCT: 40.8 % (ref 35.0–45.0)
Hemoglobin: 13.4 g/dL (ref 11.7–15.5)
MCH: 29.3 pg (ref 27.0–33.0)
MCHC: 32.8 g/dL (ref 32.0–36.0)
MCV: 89.3 fL (ref 80.0–100.0)
MPV: 11.6 fL (ref 7.5–12.5)
Monocytes Relative: 9.6 %
Neutro Abs: 3981 {cells}/uL (ref 1500–7800)
Neutrophils Relative %: 62.2 %
Platelets: 218 10*3/uL (ref 140–400)
RBC: 4.57 10*6/uL (ref 3.80–5.10)
RDW: 12.5 % (ref 11.0–15.0)
Total Lymphocyte: 26.2 %
WBC: 6.4 10*3/uL (ref 3.8–10.8)

## 2023-02-25 LAB — COMPREHENSIVE METABOLIC PANEL
AG Ratio: 1.7 (calc) (ref 1.0–2.5)
ALT: 13 U/L (ref 6–29)
AST: 17 U/L (ref 10–30)
Albumin: 4.3 g/dL (ref 3.6–5.1)
Alkaline phosphatase (APISO): 57 U/L (ref 31–125)
BUN: 20 mg/dL (ref 7–25)
CO2: 26 mmol/L (ref 20–32)
Calcium: 9.4 mg/dL (ref 8.6–10.2)
Chloride: 106 mmol/L (ref 98–110)
Creat: 0.72 mg/dL (ref 0.50–0.99)
Globulin: 2.5 g/dL (ref 1.9–3.7)
Glucose, Bld: 86 mg/dL (ref 65–99)
Potassium: 4.3 mmol/L (ref 3.5–5.3)
Sodium: 140 mmol/L (ref 135–146)
Total Bilirubin: 0.3 mg/dL (ref 0.2–1.2)
Total Protein: 6.8 g/dL (ref 6.1–8.1)

## 2023-02-25 LAB — LIPID PANEL
Cholesterol: 171 mg/dL (ref <200)
HDL: 57 mg/dL (ref >=50)
LDL Cholesterol (Calc): 87 mg/dL
Non-HDL Cholesterol (Calc): 114 mg/dL (ref <130)
Total CHOL/HDL Ratio: 3 (calc) (ref <5.0)
Triglycerides: 176 mg/dL — ABNORMAL HIGH (ref <150)

## 2023-02-25 LAB — HEMOGLOBIN A1C
Hgb A1c MFr Bld: 5.5 %{Hb} (ref <5.7)
Mean Plasma Glucose: 111 mg/dL
eAG (mmol/L): 6.2 mmol/L

## 2023-02-25 LAB — TSH: TSH: 0.71 m[IU]/L

## 2023-02-26 ENCOUNTER — Other Ambulatory Visit (HOSPITAL_COMMUNITY): Payer: Self-pay

## 2023-02-26 ENCOUNTER — Telehealth: Payer: Self-pay | Admitting: Pharmacist

## 2023-02-26 NOTE — Telephone Encounter (Signed)
Pharmacy Patient Advocate Encounter  Received notification from [facility] that Prior Authorization for Zepbound 2.5MG /0.5ML pen-injectors has been APPROVED from 02/26/2023 to 10/24/2023. Ran test claim, Copay is $24.99. This test claim was processed through [facility]- copay amounts may vary at other pharmacies due to pharmacy/plan contracts, or as the patient moves through the different stages of their insurance plan.   PA #/Case ID/Reference #: [id number]

## 2023-02-26 NOTE — Telephone Encounter (Signed)
Pharmacy Patient Advocate Encounter   Received notification from CoverMyMeds that prior authorization for Zepbound 2.5MG /0.5ML pen-injectors is required/requested.   Insurance verification completed.   The patient is insured through [facility] .   Per test claim: PA required; PA submitted to above mentioned insurance via CoverMyMeds Key/confirmation #/EOC [id number] Status is pending

## 2023-03-02 ENCOUNTER — Telehealth: Payer: Self-pay

## 2023-03-02 NOTE — Telephone Encounter (Signed)
Copied and pasted CRM  Reason for CRM: pt called and stated that she was on buPROPion 150mg  for a couple weeks and was supposed to up to 300mg . She would like to stay on 150mg  for now because she is still experiencing some of the side effects. She stated that she is out of the 150 mg dos and needs a refill. Please call and advise.

## 2023-03-03 ENCOUNTER — Encounter: Payer: Self-pay | Admitting: Podiatry

## 2023-03-03 ENCOUNTER — Ambulatory Visit (INDEPENDENT_AMBULATORY_CARE_PROVIDER_SITE_OTHER): Payer: 59

## 2023-03-03 ENCOUNTER — Ambulatory Visit: Payer: 59 | Admitting: Podiatry

## 2023-03-03 DIAGNOSIS — M2041 Other hammer toe(s) (acquired), right foot: Secondary | ICD-10-CM

## 2023-03-03 MED ORDER — BUPROPION HCL ER (XL) 150 MG PO TB24: 150.0000 mg | ORAL_TABLET | Freq: Every day | ORAL | 6 refills | Status: DC

## 2023-03-03 NOTE — Telephone Encounter (Signed)
No further action needed.

## 2023-03-03 NOTE — Telephone Encounter (Signed)
OK wellbutrin xl 150mg  rx sent today

## 2023-03-03 NOTE — Progress Notes (Signed)
Chief Complaint  Patient presents with   Toe Pain    "I have a corn on my pinkie toe on my right foot.  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 female presenting today as a new patient for evaluation of pain and tenderness associated to the right fifth digit secondary to a corn/callus.  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  Most recently over the last 2 years she has returned to figure skating and it has become very symptomatic especially of the last few months.  Past Medical History:  Diagnosis Date   Allergic rhinitis    Bilateral knee pain    Cystic fibrosis carrier    GAD (generalized anxiety disorder)    lexapro 5 qd helpful, 10 qd too much emotional blunting   History of Lyme disease    History of pregnancy induced hypertension    Hypercholesterolemia     Past Surgical History:  Procedure Laterality Date   LAPAROSCOPY N/A 12/30/2016   Procedure: LAPAROSCOPY OPERATIVE;  Surgeon: Ranae Pila, MD;  Location: [facility];  Service: Gynecology;  Laterality: N/A;   UNILATERAL SALPINGECTOMY Left 12/30/2016   Procedure: UNILATERAL SALPINGECTOMY;  Surgeon: Ranae Pila, MD;  Location: [facility];  Service: Gynecology;  Laterality: Left;    Allergies  Allergen Reactions   Amoxicillin Hives   Penicillins Hives     Physical Exam: General: The patient is alert and oriented x3 in no acute distress.  Dermatology: Skin is warm, dry and supple bilateral lower extremities.   Vascular: Palpable pedal pulses bilaterally. Capillary refill within normal limits.  No appreciable edema.  No erythema.  Neurological: Grossly intact via light touch  Musculoskeletal Exam: No pedal deformities noted.  Reducible adductovarus hammertoe deformity noted to the  fifth digit of the right foot  Radiographic Exam RT foot 03/03/2023:  Normal osseous mineralization. Joint spaces preserved.  No fractures or osseous irregularities noted.  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  X-rays reviewed -For now recommend conservative treatment.  Excisional debridement of the symptomatic callus was performed today using a 312 scalpel.  No incident or bleeding.  There was some underlying small hematoma.  Please see above noted photo.  Stable. -We did discuss possible surgery to correct for the adductovarus hammertoe deformity of the right digit however the patient is very active in her competitive figure skating.  For now I would advise against surgery and recommend to continue to pursue conservative management -When not ice-skating recommend wide fitting shoes that are comfortable and do not constrict the toebox area -Return to clinic 6 weeks       Debbie French, DPM [facility]  Dr. Felecia French, DPM    [address], [place] [address]  Office [phone]  Fax [phone]

## 2023-03-08 ENCOUNTER — Ambulatory Visit: Payer: 59 | Admitting: Podiatry

## 2023-03-11 ENCOUNTER — Encounter: Payer: Self-pay | Admitting: Family Medicine

## 2023-03-11 ENCOUNTER — Telehealth: Payer: 59 | Admitting: Physician Assistant

## 2023-03-11 DIAGNOSIS — L03113 Cellulitis of right upper limb: Secondary | ICD-10-CM | POA: Diagnosis not present

## 2023-03-11 MED ORDER — OSELTAMIVIR PHOSPHATE 75 MG PO CAPS: 75.0000 mg | ORAL_CAPSULE | Freq: Two times a day (BID) | ORAL | 0 refills | Status: AC

## 2023-03-11 MED ORDER — SULFAMETHOXAZOLE-TRIMETHOPRIM 800-160 MG PO TABS: 1.0000 | ORAL_TABLET | Freq: Two times a day (BID) | ORAL | 0 refills | Status: DC

## 2023-03-11 NOTE — Progress Notes (Signed)
 I have spent 5 minutes in review of e-visit questionnaire, review and updating patient chart, medical decision making and response to patient.   Piedad Climes, PA-C

## 2023-03-11 NOTE — Telephone Encounter (Signed)
 Rx sent

## 2023-03-11 NOTE — Progress Notes (Signed)
 E-Visit for Cellulitis  We are sorry that you are not feeling well. Here is how we plan to help!  Based on what you shared with me it looks like you have cellulitis.  Cellulitis looks like areas of skin redness, swelling, and warmth; it develops as a result of bacteria entering under the skin. Little red spots and/or bleeding can be seen in skin, and tiny surface sacs containing fluid can occur. Fever can be present.   I have prescribed:  Bactrim  DS 1 tablet by mouth twice a day for 7 days  Keep the skin clean and dry. I do recommend a cold compress to help with swelling and inflammation. You may also see some benefit from an OTC non-drowsy antihistamine like Claritin in terms of redness and warmth reduction.  HOME CARE:  Take your medications as ordered and take all of them, even if the skin irritation appears to be healing.   GET HELP RIGHT AWAY IF:  Symptoms that don't begin to go away within 48 hours. Severe redness persists or worsens If the area turns color, spreads or swells. If it blisters and opens, develops yellow-brown crust or bleeds. You develop a fever or chills. If the pain increases or becomes unbearable.  Are unable to keep fluids and food down.  MAKE SURE YOU   Understand these instructions. Will watch your condition. Will get help right away if you are not doing well or get worse.  Thank you for choosing an e-visit.  Your e-visit answers were reviewed by a board certified advanced clinical practitioner to complete your personal care plan. Depending upon the condition, your plan could have included both over the counter or prescription medications.  Please review your pharmacy choice. Make sure the pharmacy is open so you can pick up prescription now. If there is a problem, you may contact your provider through [employer] and have the prescription routed to another pharmacy.  Your safety is important to us . If you have drug allergies check your prescription  carefully.   For the next 24 hours you can use MyChart to ask questions about today's visit, request a non-urgent call back, or ask for a work or school excuse. You will get an email in the next two days asking about your experience. I hope that your e-visit has been valuable and will speed your recovery.

## 2023-03-19 NOTE — Patient Instructions (Signed)

## 2023-03-22 ENCOUNTER — Encounter: Payer: Self-pay | Admitting: Family Medicine

## 2023-03-22 ENCOUNTER — Ambulatory Visit: Payer: 59 | Admitting: Family Medicine

## 2023-03-22 VITALS — BP 127/84 | HR 76 | Ht 62.0 in | Wt 140.4 lb

## 2023-03-22 DIAGNOSIS — Z7689 Persons encountering health services in other specified circumstances: Secondary | ICD-10-CM

## 2023-03-22 DIAGNOSIS — E559 Vitamin D deficiency, unspecified: Secondary | ICD-10-CM

## 2023-03-22 DIAGNOSIS — F411 Generalized anxiety disorder: Secondary | ICD-10-CM

## 2023-03-22 DIAGNOSIS — E663 Overweight: Secondary | ICD-10-CM | POA: Diagnosis not present

## 2023-03-22 MED ORDER — TIRZEPATIDE-WEIGHT MANAGEMENT 2.5 MG/0.5ML ~~LOC~~ SOLN: 2.5000 mg | SUBCUTANEOUS | 0 refills | Status: DC

## 2023-03-22 NOTE — Progress Notes (Signed)
OFFICE VISIT  03/22/2023  CC:  Chief Complaint  Patient presents with   Weight Management    4 wk follow up Zepbound    Patient is a 43 y.o. female who presents for follow-up overweight-weight management as well as follow-up GAD.`  A/P as of last visit: "#1 Overweight (BMI 27).  Difficulty losing weight despite ideal exercise and dietary habits. Interested in [place]. Prescription for 2.5 mg weekly dose was printed today.   #2 GAD. Continue Lexapro 5 mg a day. Continue the plan for increasing Wellbutrin XL to 300 mg a day as per her GYN MD's recent plan."  INTERIM HX: All labs normal last visit.  Zepbound caused significant nausea and bad diarrhea for 2 days after the first injection.  Each injection since then has been better.  She takes her fourth injection pretty soon. She lost 5 pounds within the first week of being on the medicine but only about a pound a week since. She is eating around 1100 cal a day, is shooting for more than that since she is very active--including ice-skating regularly.  She decided to stop the Wellbutrin because she was feeling more anxious and internal tremulousness on it.  She was feeling bad on the Zepbound at that time as well so decided she just wants to hold off on this medication right now. She takes 5 mg Lexapro some days and a 10 mg dose other days.    Past Medical History:  Diagnosis Date   Allergic rhinitis    Bilateral knee pain    Cystic fibrosis carrier    GAD (generalized anxiety disorder)    lexapro 5 qd helpful, 10 qd too much emotional blunting   History of Lyme disease    History of pregnancy induced hypertension    Hypercholesterolemia     Past Surgical History:  Procedure Laterality Date   LAPAROSCOPY N/A 12/30/2016   Procedure: LAPAROSCOPY OPERATIVE;  Surgeon: Ranae Pila, MD;  Location: [facility];  Service: Gynecology;  Laterality: N/A;   UNILATERAL SALPINGECTOMY Left 12/30/2016   Procedure: UNILATERAL  SALPINGECTOMY;  Surgeon: Ranae Pila, MD;  Location: [facility];  Service: Gynecology;  Laterality: Left;    Outpatient Medications Prior to Visit  Medication Sig Dispense Refill   escitalopram (LEXAPRO) 10 MG tablet Take 10 mg by mouth daily.     ondansetron (ZOFRAN-ODT) 4 MG disintegrating tablet Take 1 tablet (4 mg total) by mouth every 8 (eight) hours as needed. 20 tablet 0   Prenatal Vit-Fe Fumarate-FA (PRENATAL MULTIVITAMIN) TABS Take 1 tablet by mouth at bedtime.      buPROPion (WELLBUTRIN XL) 150 MG 24 hr tablet Take 1 tablet (150 mg total) by mouth daily. 30 tablet 6   sulfamethoxazole-trimethoprim (BACTRIM DS) 800-160 MG tablet Take 1 tablet by mouth 2 (two) times daily. 14 tablet 0   tirzepatide (ZEPBOUND) 2.5 MG/0.5ML injection vial Inject 2.5 mg into the skin once a week. 2 mL 0   No facility-administered medications prior to visit.    Allergies  Allergen Reactions   Amoxicillin Hives   Penicillins Hives    Review of Systems As per HPI  PE:    03/22/2023    1:22 PM 02/24/2023    1:50 PM 08/05/2018    9:17 AM  Vitals with BMI  Height 5\' 2"  5\' 2"    Weight 140 lbs 6 oz 149 lbs 13 oz   BMI 25.67 27.39   Systolic 127 133 161  Diastolic 84 84 91  Pulse  76 87 68     Physical Exam  Gen: Alert, well appearing.  Patient is oriented to person, place, time, and situation. AFFECT: pleasant, lucid thought and speech. No further exam today  LABS:  Last CBC Lab Results  Component Value Date   WBC 6.4 02/24/2023   HGB 13.4 02/24/2023   HCT 40.8 02/24/2023   MCV 89.3 02/24/2023   MCH 29.3 02/24/2023   RDW 12.5 02/24/2023   PLT 218 02/24/2023   Last metabolic panel Lab Results  Component Value Date   GLUCOSE 86 02/24/2023   NA 140 02/24/2023   K 4.3 02/24/2023   CL 106 02/24/2023   CO2 26 02/24/2023   BUN 20 02/24/2023   CREATININE 0.72 02/24/2023   GFRNONAA >60 08/05/2018   CALCIUM 9.4 02/24/2023   PROT 6.8 02/24/2023   ALBUMIN 2.5 (L) 08/05/2018    BILITOT 0.3 02/24/2023   ALKPHOS 70 08/05/2018   AST 17 02/24/2023   ALT 13 02/24/2023   ANIONGAP 8 08/05/2018   Last lipids Lab Results  Component Value Date   CHOL 171 02/24/2023   HDL 57 02/24/2023   LDLCALC 87 02/24/2023   TRIG 176 (H) 02/24/2023   CHOLHDL 3.0 02/24/2023   Last hemoglobin A1c Lab Results  Component Value Date   HGBA1C 5.5 02/24/2023   Last thyroid functions Lab Results  Component Value Date   TSH 0.71 02/24/2023   IMPRESSION AND PLAN:  #1 overweight, BMI 27, weight management. She has had significant response to Zepbound 2.5 mg weekly.  Significant GI side effects initially but getting less with each injection. Will continue with same dosing and reassess in 1 month. She is down a total of 9 pounds over the last 1 month.  #2 GAD, doing pretty well currently on Lexapro 5 mg/day and sometimes will take 10 mg a day. Will take Wellbutrin off of her med list at this time.  #3 vitamin D deficiency. She takes 5000 units of vitamin D daily long-term.  She has been out of the medication for a couple of weeks and wants to reassess her level today to determine need.  An After Visit Summary was printed and given to the patient.  FOLLOW UP: Return in about 4 weeks (around 04/19/2023) for f/u wt mgmt.  Signed:  Santiago Bumpers, MD           03/22/2023

## 2023-03-23 ENCOUNTER — Encounter: Payer: Self-pay | Admitting: Family Medicine

## 2023-03-23 LAB — VITAMIN D 25 HYDROXY (VIT D DEFICIENCY, FRACTURES): Vit D, 25-Hydroxy: 44 ng/mL (ref 30–100)

## 2023-04-14 ENCOUNTER — Ambulatory Visit: Payer: 59 | Admitting: Podiatry

## 2023-04-18 NOTE — Progress Notes (Unsigned)
 OFFICE VISIT  04/19/2023  CC:  Chief Complaint  Patient presents with   Weight Management Screening    Patient is a 43 y.o. female who presents for 1 mo f/u wt mgmt. A/P as of last visit: "#1 overweight, BMI 27, weight management. She has had significant response to Zepbound 2.5 mg weekly.  Significant GI side effects initially but getting less with each injection. Will continue with same dosing and reassess in 1 month. She is down a total of 9 pounds over the last 1 month.   #2 GAD, doing pretty well currently on Lexapro 5 mg/day and sometimes will take 10 mg a day. Will take Wellbutrin off of her med list at this time.   #3 vitamin D deficiency. She takes 5000 units of vitamin D daily long-term.  She has been out of the medication for a couple of weeks and wants to reassess her level today to determine need."  INTERIM HX: Debbie French feels very well. Appetite is down quite a bit the first 2 days after each injection but she does not feel nausea or upset stomach or cramping. She is pleased with her gradual weight loss.  Vit D level 44 last visit. I recommended she take 1000 U vit D daily.   Past Medical History:  Diagnosis Date   Allergic rhinitis    Bilateral knee pain    Cystic fibrosis carrier    GAD (generalized anxiety disorder)    lexapro 5 qd helpful, 10 qd too much emotional blunting   History of Lyme disease    History of pregnancy induced hypertension    Hypercholesterolemia     Past Surgical History:  Procedure Laterality Date   LAPAROSCOPY N/A 12/30/2016   Procedure: LAPAROSCOPY OPERATIVE;  Surgeon: Ranae Pila, MD;  Location: [facility];  Service: Gynecology;  Laterality: N/A;   UNILATERAL SALPINGECTOMY Left 12/30/2016   Procedure: UNILATERAL SALPINGECTOMY;  Surgeon: Ranae Pila, MD;  Location: [facility];  Service: Gynecology;  Laterality: Left;    Outpatient Medications Prior to Visit  Medication Sig Dispense Refill   escitalopram (LEXAPRO)  10 MG tablet Take 10 mg by mouth daily.     ondansetron (ZOFRAN-ODT) 4 MG disintegrating tablet Take 1 tablet (4 mg total) by mouth every 8 (eight) hours as needed. 20 tablet 0   Prenatal Vit-Fe Fumarate-FA (PRENATAL MULTIVITAMIN) TABS Take 1 tablet by mouth at bedtime.      tirzepatide (ZEPBOUND) 2.5 MG/0.5ML injection vial Inject 2.5 mg into the skin once a week. 2 mL 0   No facility-administered medications prior to visit.    Allergies  Allergen Reactions   Amoxicillin Hives   Penicillins Hives    Review of Systems As per HPI  PE:    04/19/2023    9:38 AM 03/22/2023    1:22 PM 02/24/2023    1:50 PM  Vitals with BMI  Height 5\' 2"  5\' 2"  5\' 2"   Weight 133 lbs 140 lbs 6 oz 149 lbs 13 oz  BMI 24.32 25.67 27.39  Systolic 109 127 245  Diastolic 72 84 84  Pulse 69 76 87    Physical Exam  Gen: Alert, well appearing.  Patient is oriented to person, place, time, and situation. AFFECT: pleasant, lucid thought and speech. No further exam today  LABS:  Last CBC Lab Results  Component Value Date   WBC 6.4 02/24/2023   HGB 13.4 02/24/2023   HCT 40.8 02/24/2023   MCV 89.3 02/24/2023   MCH 29.3 02/24/2023  RDW 12.5 02/24/2023   PLT 218 02/24/2023   Last metabolic panel Lab Results  Component Value Date   GLUCOSE 86 02/24/2023   NA 140 02/24/2023   K 4.3 02/24/2023   CL 106 02/24/2023   CO2 26 02/24/2023   BUN 20 02/24/2023   CREATININE 0.72 02/24/2023   GFRNONAA >60 08/05/2018   CALCIUM 9.4 02/24/2023   PROT 6.8 02/24/2023   ALBUMIN 2.5 (L) 08/05/2018   BILITOT 0.3 02/24/2023   ALKPHOS 70 08/05/2018   AST 17 02/24/2023   ALT 13 02/24/2023   ANIONGAP 8 08/05/2018   Last lipids Lab Results  Component Value Date   CHOL 171 02/24/2023   HDL 57 02/24/2023   LDLCALC 87 02/24/2023   TRIG 176 (H) 02/24/2023   CHOLHDL 3.0 02/24/2023   Last hemoglobin A1c Lab Results  Component Value Date   HGBA1C 5.5 02/24/2023   Last thyroid functions Lab Results   Component Value Date   TSH 0.71 02/24/2023   Last vitamin D Lab Results  Component Value Date   VD25OH 44 03/22/2023   IMPRESSION AND PLAN:  #1 overweight, BMI 27-->down to 24 now. She has had significant response to Zepbound 2.5 mg weekly.   Tolerating med well. Will continue with same dosing and reassess in 3-4 months.  She is doing excellent with exercise as well, is participating ice-skating competitions.  She will start light workouts with weights soon as well.  An After Visit Summary was printed and given to the patient.  FOLLOW UP: Return in about 4 months (around 08/19/2023) for Follow-up weight management.  Signed:  Santiago Bumpers, MD           04/19/2023

## 2023-04-19 ENCOUNTER — Encounter: Payer: Self-pay | Admitting: Family Medicine

## 2023-04-19 ENCOUNTER — Ambulatory Visit: Payer: 59 | Admitting: Family Medicine

## 2023-04-19 VITALS — BP 109/72 | HR 69 | Temp 98.4°F | Ht 62.0 in | Wt 133.0 lb

## 2023-04-19 DIAGNOSIS — E663 Overweight: Secondary | ICD-10-CM | POA: Diagnosis not present

## 2023-04-19 DIAGNOSIS — Z7689 Persons encountering health services in other specified circumstances: Secondary | ICD-10-CM

## 2023-04-19 MED ORDER — TIRZEPATIDE-WEIGHT MANAGEMENT 2.5 MG/0.5ML ~~LOC~~ SOLN: 2.5000 mg | SUBCUTANEOUS | 3 refills | Status: DC

## 2023-04-27 ENCOUNTER — Encounter: Payer: Self-pay | Admitting: Family Medicine

## 2023-05-03 ENCOUNTER — Ambulatory Visit: Admitting: Podiatry

## 2023-05-03 ENCOUNTER — Encounter: Payer: Self-pay | Admitting: Podiatry

## 2023-05-03 DIAGNOSIS — M2041 Other hammer toe(s) (acquired), right foot: Secondary | ICD-10-CM | POA: Diagnosis not present

## 2023-05-03 NOTE — Progress Notes (Signed)
 Chief Complaint  Patient presents with   Toe Pain    Follow up hammertoe right   "Its better, but I have been skating again and now its starting to get sore like before"   Nail Problem    Hallux left - damaged nail with skates, nail fell off, still dark, little tender    HPI: 43 y.o. female presenting today as a new patient for evaluation of pain and tenderness associated to the right fifth digit secondary to a corn/callus.  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  Most recently over the last 2 years she has returned to figure skating and it has become very symptomatic especially of the last few months.  Past Medical History:  Diagnosis Date   Allergic rhinitis    Bilateral knee pain    Cystic fibrosis carrier    GAD (generalized anxiety disorder)    lexapro 5 qd helpful, 10 qd too much emotional blunting   History of Lyme disease    History of pregnancy induced hypertension    Hypercholesterolemia     Past Surgical History:  Procedure Laterality Date   LAPAROSCOPY N/A 12/30/2016   Procedure: LAPAROSCOPY OPERATIVE;  Surgeon: Ranae Pila, MD;  Location: [facility];  Service: Gynecology;  Laterality: N/A;   UNILATERAL SALPINGECTOMY Left 12/30/2016   Procedure: UNILATERAL SALPINGECTOMY;  Surgeon: Ranae Pila, MD;  Location: [facility];  Service: Gynecology;  Laterality: Left;    Allergies  Allergen Reactions   Amoxicillin Hives   Penicillins Hives     RT foot 03/03/2023  Physical Exam: General: The patient is alert and oriented x3 in no acute distress.  Dermatology: Skin is warm, dry and supple bilateral lower extremities.   Vascular: Palpable pedal pulses bilaterally. Capillary refill within normal limits.  No appreciable edema.  No erythema.  Neurological: Grossly intact via light touch  Musculoskeletal Exam: No pedal deformities noted.  Reducible adductovarus hammertoe deformity  noted to the fifth digit of the right foot  Radiographic Exam RT foot 03/03/2023:  Normal osseous mineralization. Joint spaces preserved.  No fractures or osseous irregularities noted.  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  Dystrophic nail plate right hallux  -Patient evaluated.   -Continue conservative treatment.  Patient is okay with this plan.  She is still very active with ice-skating.  -Excisional debridement of the symptomatic callus was performed today using a 312 scalpel.  No incident or bleeding.  No open wound after debridement. -Again we did discuss possible surgery to correct for the adductovarus hammertoe deformity of the right digit however the patient is very active in her competitive figure skating.  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  They have been applying Tolcylen antifungal topical.       [name], DPM [facility]  Dr. [name], DPM    [address]  [place], [place] [address]                Office [phone]  Fax [phone]

## 2023-06-19 ENCOUNTER — Telehealth: Admitting: Nurse Practitioner

## 2023-06-19 DIAGNOSIS — B9689 Other specified bacterial agents as the cause of diseases classified elsewhere: Secondary | ICD-10-CM | POA: Diagnosis not present

## 2023-06-19 DIAGNOSIS — J329 Chronic sinusitis, unspecified: Secondary | ICD-10-CM

## 2023-06-19 MED ORDER — DOXYCYCLINE HYCLATE 100 MG PO TABS: 100.0000 mg | ORAL_TABLET | Freq: Two times a day (BID) | ORAL | 0 refills | Status: AC

## 2023-06-19 NOTE — Progress Notes (Signed)

## 2023-06-19 NOTE — Progress Notes (Signed)
 I have spent 5 minutes in review of e-visit questionnaire, review and updating patient chart, medical decision making and response to patient.   Claiborne Rigg, NP

## 2023-06-21 ENCOUNTER — Ambulatory Visit: Admitting: Podiatry

## 2023-06-21 ENCOUNTER — Ambulatory Visit: Admitting: Family Medicine

## 2023-07-23 ENCOUNTER — Ambulatory Visit: Admitting: Family Medicine

## 2023-07-23 ENCOUNTER — Encounter: Payer: Self-pay | Admitting: Family Medicine

## 2023-07-23 VITALS — BP 112/77 | HR 103 | Temp 98.5°F | Ht 62.0 in | Wt 123.8 lb

## 2023-07-23 DIAGNOSIS — Z760 Encounter for issue of repeat prescription: Secondary | ICD-10-CM | POA: Diagnosis not present

## 2023-07-23 DIAGNOSIS — Z7689 Persons encountering health services in other specified circumstances: Secondary | ICD-10-CM

## 2023-07-23 DIAGNOSIS — Z7189 Other specified counseling: Secondary | ICD-10-CM | POA: Diagnosis not present

## 2023-07-23 MED ORDER — TIRZEPATIDE-WEIGHT MANAGEMENT 2.5 MG/0.5ML ~~LOC~~ SOLN: 2.5000 mg | SUBCUTANEOUS | 6 refills | Status: DC

## 2023-07-23 NOTE — Progress Notes (Signed)
 OFFICE VISIT  07/23/2023  CC:  Chief Complaint  Patient presents with   Weight Management    Follow up on Zepbound ; taking every 10-14 days to try weaning off, has not lost any more weight in about 1 month.     Patient is a 43 y.o. female who presents for 48-month follow-up weight management, Zepbound .  INTERIM HX: Debbie French is doing great.  She feels like she has reached her goal weight loss. She is beginning to space out her Zepbound  2.5 mg injections to 10 to 14 days. She admits to struggling with carb craving in the last 2 to 3 days before the injection if she waits 14 days. Gains a few pounds during this time as well.  She has absolutely no side effects from the medication.  Past Medical History:  Diagnosis Date   Allergic rhinitis    Bilateral knee pain    Cystic fibrosis carrier    GAD (generalized anxiety disorder)    lexapro  5 qd helpful, 10 qd too much emotional blunting   History of Lyme disease    History of pregnancy induced hypertension    Hypercholesterolemia     Past Surgical History:  Procedure Laterality Date   LAPAROSCOPY N/A 12/30/2016   Procedure: LAPAROSCOPY OPERATIVE;  Surgeon: Concepcion Deck, MD;  Location: [facility];  Service: Gynecology;  Laterality: N/A;   UNILATERAL SALPINGECTOMY Left 12/30/2016   Procedure: UNILATERAL SALPINGECTOMY;  Surgeon: Concepcion Deck, MD;  Location: [facility];  Service: Gynecology;  Laterality: Left;    Outpatient Medications Prior to Visit  Medication Sig Dispense Refill   escitalopram  (LEXAPRO ) 10 MG tablet Take 10 mg by mouth daily.     ondansetron  (ZOFRAN -ODT) 4 MG disintegrating tablet Take 1 tablet (4 mg total) by mouth every 8 (eight) hours as needed. 20 tablet 0   Prenatal Vit-Fe Fumarate-FA (PRENATAL MULTIVITAMIN) TABS Take 1 tablet by mouth at bedtime.      tirzepatide  (ZEPBOUND ) 2.5 MG/0.5ML injection vial Inject 2.5 mg into the skin once a week. 6 mL 3   No facility-administered medications prior to  visit.    Allergies  Allergen Reactions   Amoxicillin Hives   Penicillins Hives    Review of Systems As per HPI  PE:    07/23/2023   10:38 AM 04/19/2023    9:38 AM 03/22/2023    1:22 PM  Vitals with BMI  Height 5' 2 5' 2 5' 2  Weight 123 lbs 13 oz 133 lbs 140 lbs 6 oz  BMI 22.64 24.32 25.67  Systolic 112 109 604  Diastolic 77 72 84  Pulse 103 69 76   Physical Exam  Gen: Alert, well appearing.  Patient is oriented to person, place, time, and situation. AFFECT: pleasant, lucid thought and speech. No further exam today  LABS:  Last CBC Lab Results  Component Value Date   WBC 6.4 02/24/2023   HGB 13.4 02/24/2023   HCT 40.8 02/24/2023   MCV 89.3 02/24/2023   MCH 29.3 02/24/2023   RDW 12.5 02/24/2023   PLT 218 02/24/2023   Last metabolic panel Lab Results  Component Value Date   GLUCOSE 86 02/24/2023   NA 140 02/24/2023   K 4.3 02/24/2023   CL 106 02/24/2023   CO2 26 02/24/2023   BUN 20 02/24/2023   CREATININE 0.72 02/24/2023   GFRNONAA >60 08/05/2018   CALCIUM 9.4 02/24/2023   PROT 6.8 02/24/2023   ALBUMIN 2.5 (L) 08/05/2018   BILITOT 0.3 02/24/2023   ALKPHOS  70 08/05/2018   AST 17 02/24/2023   ALT 13 02/24/2023   ANIONGAP 8 08/05/2018   Last lipids Lab Results  Component Value Date   CHOL 171 02/24/2023   HDL 57 02/24/2023   LDLCALC 87 02/24/2023   TRIG 176 (H) 02/24/2023   CHOLHDL 3.0 02/24/2023   Last hemoglobin A1c Lab Results  Component Value Date   HGBA1C 5.5 02/24/2023   Last thyroid functions Lab Results  Component Value Date   TSH 0.71 02/24/2023   Last vitamin D  Lab Results  Component Value Date   VD25OH 44 03/22/2023   IMPRESSION AND PLAN:  Weight management, doing great on Zepbound  2.5 mg. She is at her goal weight (currently 123 to 124 pounds). She will go ahead and space out any injections every 10 days now and we will reevaluate in 3 months.  An After Visit Summary was printed and given to the patient.  FOLLOW  UP: Return in about 3 months (around 10/23/2023) for Weight management.  Signed:  Arletha Lady, MD           07/23/2023

## 2023-08-10 ENCOUNTER — Encounter: Payer: Self-pay | Admitting: Family Medicine

## 2023-08-10 NOTE — Telephone Encounter (Signed)
 No further action needed.

## 2023-10-22 ENCOUNTER — Ambulatory Visit: Admitting: Family Medicine

## 2023-10-25 ENCOUNTER — Other Ambulatory Visit (HOSPITAL_COMMUNITY): Payer: Self-pay

## 2023-10-27 ENCOUNTER — Encounter: Payer: Self-pay | Admitting: Family Medicine

## 2023-10-27 ENCOUNTER — Telehealth: Payer: Self-pay

## 2023-10-27 ENCOUNTER — Ambulatory Visit: Admitting: Family Medicine

## 2023-10-27 NOTE — Telephone Encounter (Signed)
 Details of complaint: Debbie French cancelled and rescheduled today's appointment due to her child being sick. She doesn't feel that it was fair to receive a notification regarding future late cancellations and a possible $50 bill as she completed the reschedule.  How would the patient like to see it resolved? Provided patient with billing contact number for more clarification, she wants the office to know that she did everything possible to communicate appointment cancellation due to sick child.  Route to Research officer, political party.  Routed to [employer], IT consultant. She will call patient back after review.  Please note: this was patient's 1st no show/cancel within 24 hours (same day cancellation) Patient cancelled appt via mychart at 10/27/2023 9:14 AM (patient was scheduled at 3:20PM and rescheduled appt on 9/26. Fee was waived. Patient will not receive no show fee of $50.

## 2023-10-28 ENCOUNTER — Other Ambulatory Visit (HOSPITAL_COMMUNITY): Payer: Self-pay

## 2023-10-28 ENCOUNTER — Telehealth: Payer: Self-pay | Admitting: Pharmacy Technician

## 2023-10-28 NOTE — Telephone Encounter (Signed)
 Pharmacy Patient Advocate Encounter   Received notification from CoverMyMeds that prior authorization for Zepbound  2.5mg  is required/requested.   Insurance verification completed.   The patient is insured through [facility].   Per test claim: PA required and submitted KEY/EOC/Request #: [id number] due to Your PA has been resolved, no additional PA is required. For further inquiries please contact the number on the back of the member prescription card.

## 2023-10-29 ENCOUNTER — Ambulatory Visit: Admitting: Family Medicine

## 2023-10-29 ENCOUNTER — Encounter: Payer: Self-pay | Admitting: Family Medicine

## 2023-10-29 VITALS — BP 124/87 | HR 114 | Temp 98.6°F | Ht 62.0 in | Wt 121.4 lb

## 2023-10-29 DIAGNOSIS — Z23 Encounter for immunization: Secondary | ICD-10-CM

## 2023-10-29 DIAGNOSIS — F411 Generalized anxiety disorder: Secondary | ICD-10-CM | POA: Diagnosis not present

## 2023-10-29 DIAGNOSIS — Z7689 Persons encountering health services in other specified circumstances: Secondary | ICD-10-CM

## 2023-10-29 MED ORDER — COVID-19 MRNA VAC-TRIS(PFIZER) 30 MCG/0.3ML IM SUSY: 0.3000 mL | PREFILLED_SYRINGE | Freq: Once | INTRAMUSCULAR | 0 refills | Status: AC

## 2023-10-29 NOTE — Progress Notes (Signed)
 OFFICE VISIT  10/29/2023  CC:  Chief Complaint  Patient presents with   Medical Management of Chronic Issues    3 month f/u     Patient is a 43 y.o. female who presents for 47-month follow-up weight management (Zepbound ) and GAD. A/P as of last visit: Weight management, doing great on Zepbound  2.5 mg. She is at her goal weight (currently 123 to 124 pounds). She will go ahead and space out any injections every 10 days now and we will reevaluate in 3 months.  INTERIM HX: Debbie French is doing well. She is taking the Zepbound  every 10 to 12 days.  Great energy level and dietary habits.  Anxiety and mood stable on Lexapro  10 mg a day.  Past Medical History:  Diagnosis Date   Allergic rhinitis    Bilateral knee pain    Cystic fibrosis carrier    GAD (generalized anxiety disorder)    lexapro  5 qd helpful, 10 qd too much emotional blunting   History of Lyme disease    History of pregnancy induced hypertension    Hypercholesterolemia     Past Surgical History:  Procedure Laterality Date   LAPAROSCOPY N/A 12/30/2016   Procedure: LAPAROSCOPY OPERATIVE;  Surgeon: Marne Kelly Nest, MD;  Location: [facility];  Service: Gynecology;  Laterality: N/A;   UNILATERAL SALPINGECTOMY Left 12/30/2016   Procedure: UNILATERAL SALPINGECTOMY;  Surgeon: Marne Kelly Nest, MD;  Location: [facility];  Service: Gynecology;  Laterality: Left;    Outpatient Medications Prior to Visit  Medication Sig Dispense Refill   escitalopram  (LEXAPRO ) 10 MG tablet Take 10 mg by mouth daily.     ondansetron  (ZOFRAN -ODT) 4 MG disintegrating tablet Take 1 tablet (4 mg total) by mouth every 8 (eight) hours as needed. 20 tablet 0   Prenatal Vit-Fe Fumarate-FA (PRENATAL MULTIVITAMIN) TABS Take 1 tablet by mouth at bedtime.      tretinoin (RETIN-A) 0.05 % cream Apply topically.     ZEPBOUND  2.5 MG/0.5ML Pen Inject 2.5 mg into the skin once a week.     tirzepatide  (ZEPBOUND ) 2.5 MG/0.5ML injection vial Inject 2.5 mg into  the skin once a week. 2 mL 6   No facility-administered medications prior to visit.    Allergies  Allergen Reactions   Amoxicillin Hives   Penicillins Hives    Review of Systems As per HPI  PE:    10/29/2023   11:11 AM 10/29/2023   11:06 AM 07/23/2023   10:38 AM  Vitals with BMI  Height  5' 2 5' 2  Weight  121 lbs 6 oz 123 lbs 13 oz  BMI  22.2 22.64  Systolic 124 132 887  Diastolic 87 83 77  Pulse  114 896     Physical Exam  Gen: Alert, well appearing.  Patient is oriented to person, place, time, and situation. AFFECT: pleasant, lucid thought and speech. No further exam today  LABS:  Last CBC Lab Results  Component Value Date   WBC 6.4 02/24/2023   HGB 13.4 02/24/2023   HCT 40.8 02/24/2023   MCV 89.3 02/24/2023   MCH 29.3 02/24/2023   RDW 12.5 02/24/2023   PLT 218 02/24/2023   Last metabolic panel Lab Results  Component Value Date   GLUCOSE 86 02/24/2023   NA 140 02/24/2023   K 4.3 02/24/2023   CL 106 02/24/2023   CO2 26 02/24/2023   BUN 20 02/24/2023   CREATININE 0.72 02/24/2023   GFRNONAA >60 08/05/2018   CALCIUM 9.4 02/24/2023   PROT  6.8 02/24/2023   ALBUMIN 2.5 (L) 08/05/2018   BILITOT 0.3 02/24/2023   ALKPHOS 70 08/05/2018   AST 17 02/24/2023   ALT 13 02/24/2023   ANIONGAP 8 08/05/2018   Last lipids Lab Results  Component Value Date   CHOL 171 02/24/2023   HDL 57 02/24/2023   LDLCALC 87 02/24/2023   TRIG 176 (H) 02/24/2023   CHOLHDL 3.0 02/24/2023   Last hemoglobin A1c Lab Results  Component Value Date   HGBA1C 5.5 02/24/2023   Last thyroid functions Lab Results  Component Value Date   TSH 0.71 02/24/2023   Last vitamin D  Lab Results  Component Value Date   VD25OH 44 03/22/2023   IMPRESSION AND PLAN:  #1 weight management, doing great on Zepbound  2.5 mg. She is at her goal weight (currently 123 to 124 pounds). She will go ahead and space out any injections every 10 days now and we will reevaluate in 3 months.  #2  GAD: Doing very well on Lexapro  10 mg long-term.  An After Visit Summary was printed and given to the patient.  FOLLOW UP: Return in about 3 months (around 01/28/2024) for Weight management. Next CPE around January/February 2026 Signed:  Gerlene Hockey, MD           10/29/2023

## 2023-11-04 ENCOUNTER — Other Ambulatory Visit (HOSPITAL_COMMUNITY): Payer: Self-pay

## 2023-12-21 ENCOUNTER — Encounter: Payer: Self-pay | Admitting: Family Medicine

## 2023-12-21 ENCOUNTER — Ambulatory Visit: Admitting: Family Medicine

## 2023-12-21 VITALS — BP 111/73 | HR 110 | Temp 97.6°F | Ht 62.0 in | Wt 122.4 lb

## 2023-12-21 DIAGNOSIS — R058 Other specified cough: Secondary | ICD-10-CM

## 2023-12-21 DIAGNOSIS — J029 Acute pharyngitis, unspecified: Secondary | ICD-10-CM | POA: Diagnosis not present

## 2023-12-21 DIAGNOSIS — J9801 Acute bronchospasm: Secondary | ICD-10-CM

## 2023-12-21 DIAGNOSIS — J069 Acute upper respiratory infection, unspecified: Secondary | ICD-10-CM | POA: Diagnosis not present

## 2023-12-21 LAB — POCT INFLUENZA A/B
Influenza A, POC: NEGATIVE
Influenza B, POC: NEGATIVE

## 2023-12-21 LAB — POCT RAPID STREP A (OFFICE): Rapid Strep A Screen: NEGATIVE

## 2023-12-21 MED ORDER — ALBUTEROL SULFATE HFA 108 (90 BASE) MCG/ACT IN AERS: INHALATION_SPRAY | RESPIRATORY_TRACT | 0 refills | Status: AC

## 2023-12-21 MED ORDER — CLINDAMYCIN HCL 300 MG PO CAPS: 300.0000 mg | ORAL_CAPSULE | Freq: Three times a day (TID) | ORAL | 0 refills | Status: AC

## 2023-12-21 NOTE — Progress Notes (Signed)
 OFFICE VISIT  12/21/2023  CC:  Chief Complaint  Patient presents with   Sore Throat    Started 11/3; has worsened since, sometimes improvement. Son had prior URI, has used Zyrtec and Tylenol /Aleve   Cough    dry   Patient is a 43 y.o. female who presents for sore throat and cough.  HPI: 2 weeks ago she got headache and sore throat as well as some cough.  She felt tired for a couple of days.  She then transition to more of a constant ache in her throat and dry cough that comes often when she talks or when she laughs.  The fatigue has resolved. She was able to ice skate yesterday without any problem.  No fever, chest pain, palpitations, dizziness, nausea, vomiting, or diarrhea.  Past Medical History:  Diagnosis Date   Allergic rhinitis    Bilateral knee pain    Cystic fibrosis carrier    GAD (generalized anxiety disorder)    lexapro  5 qd helpful, 10 qd too much emotional blunting   History of Lyme disease    History of pregnancy induced hypertension    Hypercholesterolemia     Past Surgical History:  Procedure Laterality Date   LAPAROSCOPY N/A 12/30/2016   Procedure: LAPAROSCOPY OPERATIVE;  Surgeon: Marne Kelly Nest, MD;  Location: [facility];  Service: Gynecology;  Laterality: N/A;   UNILATERAL SALPINGECTOMY Left 12/30/2016   Procedure: UNILATERAL SALPINGECTOMY;  Surgeon: Marne Kelly Nest, MD;  Location: [facility];  Service: Gynecology;  Laterality: Left;    Outpatient Medications Prior to Visit  Medication Sig Dispense Refill   escitalopram  (LEXAPRO ) 10 MG tablet Take 10 mg by mouth daily.     ondansetron  (ZOFRAN -ODT) 4 MG disintegrating tablet Take 1 tablet (4 mg total) by mouth every 8 (eight) hours as needed. 20 tablet 0   Prenatal Vit-Fe Fumarate-FA (PRENATAL MULTIVITAMIN) TABS Take 1 tablet by mouth at bedtime.      Probiotic Product (PROBIOTIC PO) Take by mouth daily.     tretinoin (RETIN-A) 0.05 % cream Apply topically.     ZEPBOUND  2.5 MG/0.5ML Pen Inject 2.5  mg into the skin once a week.     No facility-administered medications prior to visit.    Allergies  Allergen Reactions   Amoxicillin Hives   Penicillins Hives    Review of Systems  As per HPI  PE:    12/21/2023    9:01 AM 10/29/2023   11:11 AM 10/29/2023   11:06 AM  Vitals with BMI  Height 5' 2  5' 2  Weight 122 lbs 6 oz  121 lbs 6 oz  BMI 22.38  22.2  Systolic 111 124 867  Diastolic 73 87 83  Pulse 110  885     Physical Exam  Gen: Alert, well appearing.  Patient is oriented to person, place, time, and situation. AFFECT: pleasant, lucid thought and speech. ZWU:Zbzd: no injection, icteris, swelling, or exudate.  EOMI, PERRLA.  Ears: Tympanic membranes normal. Mouth: lips without lesion/swelling.  Oral mucosa pink and moist. Oropharynx without erythema, exudate, or swelling.  NECK: no adenopathy or tenderness Cardiovascular: Regular rhythm and rate without murmur Lungs show slight end-inspiratory rhonchi in the bases.  No wheezing.  Aeration is normal and symmetric. Breathing is nonlabored. Skin: No rash  LABS:  Last CBC Lab Results  Component Value Date   WBC 6.4 02/24/2023   HGB 13.4 02/24/2023   HCT 40.8 02/24/2023   MCV 89.3 02/24/2023   MCH 29.3 02/24/2023  RDW 12.5 02/24/2023   PLT 218 02/24/2023   Last metabolic panel Lab Results  Component Value Date   GLUCOSE 86 02/24/2023   NA 140 02/24/2023   K 4.3 02/24/2023   CL 106 02/24/2023   CO2 26 02/24/2023   BUN 20 02/24/2023   CREATININE 0.72 02/24/2023   GFRNONAA >60 08/05/2018   CALCIUM 9.4 02/24/2023   PROT 6.8 02/24/2023   ALBUMIN 2.5 (L) 08/05/2018   BILITOT 0.3 02/24/2023   ALKPHOS 70 08/05/2018   AST 17 02/24/2023   ALT 13 02/24/2023   ANIONGAP 8 08/05/2018   Last hemoglobin A1c Lab Results  Component Value Date   HGBA1C 5.5 02/24/2023   IMPRESSION AND PLAN:  Prolonged upper respiratory infection with periodic bronchospasm. Clindamycin  300 mg 3 times daily x 7 days (she has  penicillin allergy).  Albuterol 2 puffs every 6 hours as needed for dry cough, wheezing, or shortness of breath. (Of note, considered use of prednisone but this caused lower extremity hives in the past).  Rapid strep today NEG. Rapid flu today NEG.  An After Visit Summary was printed and given to the patient.  FOLLOW UP: Return if symptoms worsen or fail to improve.  Signed:  Gerlene Hockey, MD           12/21/2023

## 2024-01-27 ENCOUNTER — Telehealth: Admitting: Nurse Practitioner

## 2024-01-27 DIAGNOSIS — Z20828 Contact with and (suspected) exposure to other viral communicable diseases: Secondary | ICD-10-CM | POA: Diagnosis not present

## 2024-01-27 MED ORDER — OSELTAMIVIR PHOSPHATE 75 MG PO CAPS: 75.0000 mg | ORAL_CAPSULE | Freq: Every day | ORAL | 0 refills | Status: AC

## 2024-01-27 NOTE — Progress Notes (Signed)
 E visit for Flu exposure   We are sorry that you are not feeling well.  Here is how we plan to help! Based on what you have shared with me it looks like you may have possible exposure to a virus that causes influenza.  Influenza or the flu is  an infection caused by a respiratory virus. The flu virus is highly contagious and persons who did not receive their yearly flu vaccination may catch the flu from close contact.  We have anti-viral medications to treat the viruses that cause this infection. They are not a cure and only shorten the course of the infection. These prescriptions are most effective when they are given within the first 2 days of flu symptoms. Antiviral medications are indicated if you have a high risk of complications from the flu. You should  also consider an antiviral medication if you are in close contact with someone who is at risk. These medications can help patients avoid complications from the flu but have side effects that you should know.   Possible side effects from Tamiflu  or oseltamivir  include nausea, vomiting, diarrhea, dizziness, headaches, eye redness, sleep problems or other respiratory symptoms. You should not take Tamiflu  if you have an allergy to oseltamivir  or any to the ingredients in Tamiflu .  Based upon your symptoms and potential risk factors I have prescribed Oseltamivir  (Tamiflu ).  It has been sent to your designated pharmacy.  You will take one 75 mg capsule orally once daily for the next ten days, if you start to have symptoms of the flu increase to 75mg  twice a day until finished with the medication. Take the Tamiflu  with food.    For nasal congestion, you may use an oral decongestant such as Mucinex D or if you have glaucoma or high blood pressure use plain Mucinex.  Saline nasal spray or nasal drops can help and can safely be used as often as needed for congestion.  If you have a sore or scratchy throat, use a saltwater gargle-  to  teaspoon  of salt dissolved in a 4-ounce to 8-ounce glass of warm water.  Gargle the solution for approximately 15-30 seconds and then spit.  It is important not to swallow the solution.  You can also use throat lozenges/cough drops and Chloraseptic spray to help with throat pain or discomfort.  Warm or cold liquids can also be helpful in relieving throat pain.  For headache, pain or general discomfort, you can use Ibuprofen  or Tylenol  as directed.   Some authorities believe that zinc sprays or the use of Echinacea may shorten the course of your symptoms.   You are to isolate at home until you have been fever-free for at least 24 hours without a fever-reducing medication, and symptoms have been steadily improving for 24 hours.  If you must be around other household members who do not have symptoms, you need to make sure that both you and the family members are masking consistently with a high-quality mask.  If you note any worsening of symptoms despite treatment, please seek an in-person evaluation ASAP. If you note any significant shortness of breath or any chest pain, please seek ED evaluation. Please do not delay care!  ANYONE WHO HAS FLU SYMPTOMS SHOULD: Stay home. The flu is highly contagious and going out or to work exposes others! Be sure to drink plenty of fluids. Water is fine as well as fruit juices, sodas and electrolyte beverages. You may want to stay away from caffeine or  alcohol. If you are nauseated, try taking small sips of liquids. How do you know if you are getting enough fluid? Your urine should be a pale yellow or almost colorless. Get rest. Taking a steamy shower or using a humidifier may help nasal congestion and ease sore throat pain. Using a saline nasal spray works much the same way. Cough drops, hard candies and sore throat lozenges may ease your cough. Line up a caregiver. Have someone check on you regularly.  GET HELP RIGHT AWAY IF: You cannot keep down liquids or your  medications. You become short of breath Your fell like you are going to pass out or loose consciousness. Your symptoms persist after you have completed your treatment plan  MAKE SURE YOU  Understand these instructions. Will watch your condition. Will get help right away if you are not doing well or get worse.  Your e-visit answers were reviewed by a board certified advanced clinical practitioner to complete your personal care plan.  Depending on the condition, your plan could have included both over the counter or prescription medications.  If there is a problem please reply  once you have received a response from your provider.  Your safety is important to us .  If you have drug allergies check your prescription carefully.    You can use MyChart to ask questions about todays visit, request a non-urgent call back, or ask for a work or school excuse for 24 hours related to this e-Visit. If it has been greater than 24 hours you will need to follow up with your provider, or enter a new e-Visit to address those concerns.  You will get an e-mail in the next two days asking about your experience.  I hope that your e-visit has been valuable and will speed your recovery. Thank you for using e-visits.   I have spent 5 minutes in review of e-visit questionnaire, review and updating patient chart, medical decision making and response to patient.   Lauraine Kitty, FNP

## 2024-01-31 NOTE — Progress Notes (Unsigned)
 OFFICE VISIT  02/01/2024  CC:  Chief Complaint  Patient presents with   Medical Management of Chronic Issues   Patient is a 43 y.o. female who presents for 3 mo f/u wt mgmt. A/P as of last visit: #1 weight management, doing great on Zepbound  2.5 mg. She is at her goal weight (currently 123 to 124 pounds). She will go ahead and space out any injections every 10 days now and we will reevaluate in 3 months.   #2 GAD: Doing very well on Lexapro  10 mg long-term.  INTERIM HX: Feeling a bit like she's fighting off a virus. Her son had f/u and pneumonia for the last week.  Zepbound  2.5 q10d going well.  Past Medical History:  Diagnosis Date   Allergic rhinitis    Allergy 08/2008   Bilateral knee pain    Cystic fibrosis carrier    GAD (generalized anxiety disorder)    lexapro  5 qd helpful, 10 qd too much emotional blunting   History of Lyme disease    History of pregnancy induced hypertension    Hypercholesterolemia     Past Surgical History:  Procedure Laterality Date   LAPAROSCOPY N/A 12/30/2016   Procedure: LAPAROSCOPY OPERATIVE;  Surgeon: Marne Kelly Nest, MD;  Location: [facility];  Service: Gynecology;  Laterality: N/A;   UNILATERAL SALPINGECTOMY Left 12/30/2016   Procedure: UNILATERAL SALPINGECTOMY;  Surgeon: Marne Kelly Nest, MD;  Location: [facility];  Service: Gynecology;  Laterality: Left;    Outpatient Medications Prior to Visit  Medication Sig Dispense Refill   albuterol  (VENTOLIN  HFA) 108 (90 Base) MCG/ACT inhaler 2 puffs every 6 hours as needed for dry cough, wheezing, or shortness of breath 8 g 0   escitalopram  (LEXAPRO ) 10 MG tablet Take 10 mg by mouth daily.     ondansetron  (ZOFRAN -ODT) 4 MG disintegrating tablet Take 1 tablet (4 mg total) by mouth every 8 (eight) hours as needed. 20 tablet 0   Prenatal Vit-Fe Fumarate-FA (PRENATAL MULTIVITAMIN) TABS Take 1 tablet by mouth at bedtime.      Probiotic Product (PROBIOTIC PO) Take by mouth daily.      tretinoin (RETIN-A) 0.05 % cream Apply topically.     ZEPBOUND  2.5 MG/0.5ML Pen Inject 2.5 mg into the skin once a week. (Patient taking differently: Inject 2.5 mg into the skin. Every 10 days)     oseltamivir  (TAMIFLU ) 75 MG capsule Take 1 capsule (75 mg total) by mouth daily for 10 days. (Patient not taking: Reported on 02/01/2024) 10 capsule 0   No facility-administered medications prior to visit.    Allergies[1]  Review of Systems As per HPI  PE:    02/01/2024    8:36 AM 12/21/2023    9:01 AM 10/29/2023   11:11 AM  Vitals with BMI  Height 5' 2 5' 2   Weight 119 lbs 10 oz 122 lbs 6 oz   BMI 21.87 22.38   Systolic 131 111 875  Diastolic 77 73 87  Pulse 112 110      Physical Exam  Gen: Alert, well appearing.  Patient is oriented to person, place, time, and situation. ZWU:Zbzd: no injection, icteris, swelling, or exudate.  EOMI, PERRLA. Mouth: lips without lesion/swelling.  Oral mucosa pink and moist. Oropharynx without erythema, exudate, or swelling.  Neck: no LAD CV: RRR, no m/r/g.   LUNGS: CTA bilat, nonlabored resps, good aeration in all lung fields.   LABS:  Last CBC Lab Results  Component Value Date   WBC 6.4 02/24/2023  HGB 13.4 02/24/2023   HCT 40.8 02/24/2023   MCV 89.3 02/24/2023   MCH 29.3 02/24/2023   RDW 12.5 02/24/2023   PLT 218 02/24/2023   Last metabolic panel Lab Results  Component Value Date   GLUCOSE 86 02/24/2023   NA 140 02/24/2023   K 4.3 02/24/2023   CL 106 02/24/2023   CO2 26 02/24/2023   BUN 20 02/24/2023   CREATININE 0.72 02/24/2023   GFRNONAA >60 08/05/2018   CALCIUM 9.4 02/24/2023   PROT 6.8 02/24/2023   ALBUMIN 2.5 (L) 08/05/2018   BILITOT 0.3 02/24/2023   ALKPHOS 70 08/05/2018   AST 17 02/24/2023   ALT 13 02/24/2023   ANIONGAP 8 08/05/2018   Last lipids Lab Results  Component Value Date   CHOL 171 02/24/2023   HDL 57 02/24/2023   LDLCALC 87 02/24/2023   TRIG 176 (H) 02/24/2023   CHOLHDL 3.0 02/24/2023    Last hemoglobin A1c Lab Results  Component Value Date   HGBA1C 5.5 02/24/2023   IMPRESSION AND PLAN:  #1 weight management, doing great on Zepbound  2.5 mg. Wt here today 119.6 lbs.  She is at her goal. Continue zepbound  2.5mg  injections every 10 days and we will reevaluate in 3 months.  #2 Mild fatigue, exposure to flu but no symptoms at this time. She has tamiflu  to take x 10d when she travels on airline to [place] next week.  An After Visit Summary was printed and given to the patient.  FOLLOW UP: Return in about 3 months (around 05/01/2024) for routine chronic illness f/u. Next cpe around Jan/feb 2026 Signed:  Gerlene Hockey, MD           02/01/2024     [1]  Allergies Allergen Reactions   Amoxicillin Hives   Penicillins Hives

## 2024-02-01 ENCOUNTER — Encounter: Payer: Self-pay | Admitting: Family Medicine

## 2024-02-01 ENCOUNTER — Ambulatory Visit: Admitting: Family Medicine

## 2024-02-01 VITALS — BP 131/77 | HR 112 | Temp 96.7°F | Ht 62.0 in | Wt 119.6 lb

## 2024-02-01 DIAGNOSIS — Z7689 Persons encountering health services in other specified circumstances: Secondary | ICD-10-CM

## 2024-02-01 DIAGNOSIS — Z760 Encounter for issue of repeat prescription: Secondary | ICD-10-CM

## 2024-02-01 DIAGNOSIS — Z7189 Other specified counseling: Secondary | ICD-10-CM

## 2024-02-01 DIAGNOSIS — R5383 Other fatigue: Secondary | ICD-10-CM | POA: Diagnosis not present

## 2024-02-01 MED ORDER — ZEPBOUND 2.5 MG/0.5ML ~~LOC~~ SOAJ: 2.5000 mg | SUBCUTANEOUS | 1 refills | Status: AC

## 2024-02-06 ENCOUNTER — Telehealth: Admitting: Emergency Medicine

## 2024-02-06 DIAGNOSIS — J029 Acute pharyngitis, unspecified: Secondary | ICD-10-CM | POA: Diagnosis not present

## 2024-02-07 MED ORDER — CLINDAMYCIN HCL 300 MG PO CAPS: 300.0000 mg | ORAL_CAPSULE | Freq: Three times a day (TID) | ORAL | 0 refills | Status: AC

## 2024-02-07 MED ORDER — LIDOCAINE VISCOUS HCL 2 % MT SOLN: 15.0000 mL | OROMUCOSAL | 0 refills | Status: AC | PRN

## 2024-02-07 NOTE — Progress Notes (Signed)
 We are sorry that you are not feeling well.  Here is how we plan to help!  Based on what you have shared with me it is likely that you have strep pharyngitis.  Strep pharyngitis is a bacterial infection in the back of the throat causing inflammation, and is treated with antibiotics.  I have prescribed Clindamycin  300 mg three times a day for 10 days and 2% Viscous Lidocaine  5 ml gargle and swallow every 4 hours as needed for throat pain. For throat pain, we recommend over the counter oral pain relief medications such as acetaminophen  or aspirin, or anti-inflammatory medications such as ibuprofen  or naproxen sodium. Topical treatments such as oral throat lozenges or sprays may be used as needed. Strep infections are not as easily transmitted as other respiratory infections, however we still recommend that you avoid close contact with loved ones, especially the very young and elderly.  Remember to wash your hands thoroughly throughout the day as this is the number one way to prevent the spread of infection! We also recommend that you periodically wipe down door knobs and counters with disinfectant.   Home Care: Only take medications as instructed by your medical team. Complete the entire course of an antibiotic. Do not take these medications with alcohol. A steam or ultrasonic humidifier can help congestion.  You can place a towel over your head and breathe in the steam from hot water coming from a faucet. Avoid close contact with others, especially the very young and the elderly. Cover your mouth when you cough or sneeze. Always remember to wash your hands.  Get Help Right Away If: You develop worsening fever or sinus pain. You develop a severe head ache or visual changes. Your symptoms persist after you have completed your treatment plan.  Make sure you Understand these instructions. Will watch your condition. Will get help right away if you are not doing well or get worse.  Your e-visit  answers were reviewed by a board certified advanced clinical practitioner to complete your personal care plan.  Depending on the condition, your plan could have included both over the counter or prescription medications.  If there is a problem, please reply once you have received a response from your provider.  Your safety is important to us .  If you have drug allergies check your prescription carefully.    You can use MyChart to ask questions about todays visit, request a non-urgent call back, or ask for a work or school excuse for 24 hours related to this e-Visit. If it has been greater than 24 hours you will need to follow up with your provider, or enter a new e-Visit to address those concerns.  You will get an e-mail in the next two days asking about your experience.  I hope that your e-visit has been valuable and will speed your recovery. Thank you for using e-visits.  I have spent 5 minutes in review of e-visit questionnaire, review and updating patient chart, medical decision making and response to patient.   Jon Belt, PhD, FNP-BC
